# Patient Record
Sex: Female | Born: 1960 | Race: White | Hispanic: No | Smoking: Current every day smoker
Health system: Southern US, Community
[De-identification: ages and names within clinical notes are randomized; demographics above are authoritative.]

## PROBLEM LIST (undated history)

## (undated) DIAGNOSIS — F419 Anxiety disorder, unspecified: Secondary | ICD-10-CM

## (undated) DIAGNOSIS — K219 Gastro-esophageal reflux disease without esophagitis: Secondary | ICD-10-CM

---

## 2002-03-28 ENCOUNTER — Emergency Department (HOSPITAL_COMMUNITY): Admission: EM | Admit: 2002-03-28 | Discharge: 2002-03-28 | Payer: Self-pay | Admitting: Emergency Medicine

## 2002-04-04 ENCOUNTER — Emergency Department (HOSPITAL_COMMUNITY): Admission: EM | Admit: 2002-04-04 | Discharge: 2002-04-04 | Payer: Self-pay | Admitting: Emergency Medicine

## 2003-10-07 ENCOUNTER — Emergency Department (HOSPITAL_COMMUNITY): Admission: EM | Admit: 2003-10-07 | Discharge: 2003-10-07 | Payer: Self-pay

## 2003-12-15 ENCOUNTER — Emergency Department (HOSPITAL_COMMUNITY): Admission: EM | Admit: 2003-12-15 | Discharge: 2003-12-15 | Payer: Self-pay | Admitting: Emergency Medicine

## 2004-05-16 ENCOUNTER — Emergency Department (HOSPITAL_COMMUNITY): Admission: EM | Admit: 2004-05-16 | Discharge: 2004-05-16 | Payer: Self-pay | Admitting: Emergency Medicine

## 2004-05-19 ENCOUNTER — Emergency Department (HOSPITAL_COMMUNITY): Admission: EM | Admit: 2004-05-19 | Discharge: 2004-05-19 | Payer: Self-pay | Admitting: Emergency Medicine

## 2004-08-08 ENCOUNTER — Other Ambulatory Visit: Admission: RE | Admit: 2004-08-08 | Discharge: 2004-08-08 | Payer: Self-pay | Admitting: Obstetrics and Gynecology

## 2004-09-21 ENCOUNTER — Ambulatory Visit (HOSPITAL_COMMUNITY): Admission: RE | Admit: 2004-09-21 | Discharge: 2004-09-21 | Payer: Self-pay | Admitting: Orthopedic Surgery

## 2005-01-01 ENCOUNTER — Ambulatory Visit (HOSPITAL_COMMUNITY): Admission: RE | Admit: 2005-01-01 | Discharge: 2005-01-01 | Payer: Self-pay | Admitting: Obstetrics and Gynecology

## 2005-05-25 ENCOUNTER — Emergency Department (HOSPITAL_COMMUNITY): Admission: EM | Admit: 2005-05-25 | Discharge: 2005-05-25 | Payer: Self-pay | Admitting: Emergency Medicine

## 2005-08-23 ENCOUNTER — Other Ambulatory Visit: Admission: RE | Admit: 2005-08-23 | Discharge: 2005-08-23 | Payer: Self-pay | Admitting: Obstetrics and Gynecology

## 2006-09-24 ENCOUNTER — Other Ambulatory Visit: Admission: RE | Admit: 2006-09-24 | Discharge: 2006-09-24 | Payer: Self-pay | Admitting: Gynecology

## 2007-07-30 ENCOUNTER — Observation Stay (HOSPITAL_COMMUNITY): Admission: EM | Admit: 2007-07-30 | Discharge: 2007-07-31 | Payer: Self-pay | Admitting: Emergency Medicine

## 2007-10-08 ENCOUNTER — Other Ambulatory Visit: Admission: RE | Admit: 2007-10-08 | Discharge: 2007-10-08 | Payer: Self-pay | Admitting: Gynecology

## 2010-01-08 ENCOUNTER — Emergency Department (HOSPITAL_COMMUNITY): Admission: EM | Admit: 2010-01-08 | Discharge: 2010-01-08 | Payer: Self-pay | Admitting: Emergency Medicine

## 2010-05-03 ENCOUNTER — Ambulatory Visit (HOSPITAL_COMMUNITY): Admission: RE | Admit: 2010-05-03 | Discharge: 2010-05-03 | Payer: Self-pay | Admitting: Obstetrics and Gynecology

## 2010-12-01 ENCOUNTER — Encounter
Admission: RE | Admit: 2010-12-01 | Discharge: 2010-12-01 | Payer: Self-pay | Source: Home / Self Care | Attending: Neurological Surgery | Admitting: Neurological Surgery

## 2011-01-06 ENCOUNTER — Encounter: Payer: Self-pay | Admitting: Obstetrics and Gynecology

## 2011-01-31 ENCOUNTER — Other Ambulatory Visit: Payer: Self-pay | Admitting: Neurological Surgery

## 2011-01-31 DIAGNOSIS — M47812 Spondylosis without myelopathy or radiculopathy, cervical region: Secondary | ICD-10-CM

## 2011-02-01 ENCOUNTER — Other Ambulatory Visit: Payer: Self-pay

## 2011-02-09 ENCOUNTER — Ambulatory Visit
Admission: RE | Admit: 2011-02-09 | Discharge: 2011-02-09 | Disposition: A | Discharge status: Home / Self Care | Payer: BC Managed Care – PPO | Source: Ambulatory Visit | Attending: Neurological Surgery | Admitting: Neurological Surgery

## 2011-02-09 DIAGNOSIS — M47812 Spondylosis without myelopathy or radiculopathy, cervical region: Secondary | ICD-10-CM

## 2011-03-05 LAB — CBC
HCT: 41 % (ref 36.0–46.0)
Platelets: 378 10*3/uL (ref 150–400)
WBC: 10.8 10*3/uL — ABNORMAL HIGH (ref 4.0–10.5)

## 2011-03-05 LAB — COMPREHENSIVE METABOLIC PANEL
ALT: 19 U/L (ref 0–35)
AST: 40 U/L — ABNORMAL HIGH (ref 0–37)
Albumin: 4.1 g/dL (ref 3.5–5.2)
Alkaline Phosphatase: 73 U/L (ref 39–117)
Chloride: 111 mEq/L (ref 96–112)
GFR calc Af Amer: >60 mL/min (ref >60)
Potassium: 4.9 mEq/L (ref 3.5–5.1)
Total Bilirubin: 0.6 mg/dL (ref 0.3–1.2)

## 2011-05-01 NOTE — Discharge Summary (Signed)
NAMELAKEISHA, [name] NO.:  [id number]   MEDICAL RECORD NO.:  [id number]          PATIENT TYPE:  INP   LOCATION:  3704                         FACILITY:  [facility]   PHYSICIAN:  Altha Harm, MDDATE OF BIRTH:  Dec 29, 1960   DATE OF ADMISSION:  07/30/2007  DATE OF DISCHARGE:  07/31/2007                               DISCHARGE SUMMARY   DISCHARGE DISPOSITION:  Home.   FINAL DISCHARGE DIAGNOSES:  1. Chest pain, atypical.  2. Hyperlipidemia.  3. Menopause.  4. History of depression.  5. Tobacco use disorder.   DISCHARGE MEDICATIONS:  1. Niaspan 500 mg p.o. h.s.  2. Aspirin 325 mg p.o. 30 minutes before taking Niaspan.  3. Simvastatin 20 mg p.o. q.h.s.  4. Prilosec 20 mg p.o. daily.  5. Cymbalta 60 mg p.o. every other day.  6. Estrogen at the dose prior to admission.  7. Vitamin B12 one tab p.o. daily.  8. Vitamin D 1 tab p.o. daily.  9. Melatonin p.o. daily.   CONSULTANTS:  [facility], Dr. Tresa Endo.   PROCEDURE:  None.   DIAGNOSTIC STUDIES:  1. Persantine Myoview.  Results are still pending.  2. Chest x-ray, which shows no active lung disease.  Lungs      hyperinflated.   CODE STATUS:  FULL CODE.   ALLERGIES:  NO KNOWN DRUG ALLERGIES.   PRIMARY CARE PHYSICIAN:  Dr. Juleen China.   CHIEF COMPLAINT:  Chest pain.   HISTORY OF PRESENT ILLNESS:  Please see H&P dictated by Dr. Lovell Sheehan for  details of the HPI.   HOSPITAL COURSE:  Chest pain.  The patient was admitted.  Her point of  care markers were negative and her enzymes were cycled, which continued  to be negative throughout her hospital stay.  [facility] was consulted.  Stress test and Persantine Myoview was  performed, the results of which are still pending.  The patient also had  her cholesterol checked, that was found to have hyperlipidemia with an  LDL elevated and a low HDL.  Per cardiology, the patient was started on  Niaspan and simvastatin.  The patient had  no further chest pain during  her hospitalization.  From the medical standpoint, the patient is  stable, however, the patient is still awaiting clearance from cardiology  to go home.  Cardiologists have made a tentative appointment on August  26/2008 at 11:30 with their office for follow up.  From a medical  standpoint, the patient is to follow up with Dr. Juleen China in approximately  1-2 weeks.  The patient should have her cholesterol rechecked in  approximately 6 weeks for further titration of medications.  At this  point, the patient is awaiting permission from cardiology to be  discharged home.      Altha Harm, MD  Electronically Signed     MAM/MEDQ  D:  07/31/2007  T:  07/31/2007  Job:  [id number]   cc:   [name], M.D.

## 2011-05-01 NOTE — H&P (Signed)
NAMEALBERTIA, [name] NO.:  [id number]   MEDICAL RECORD NO.:  [id number]          PATIENT TYPE:  INP   LOCATION:  3704                         FACILITY:  [facility]   PHYSICIAN:  Della Goo, M.D. DATE OF BIRTH:  07/30/1961   DATE OF ADMISSION:  07/30/2007  DATE OF DISCHARGE:                              HISTORY & PHYSICAL   CHIEF COMPLAINT:  Chest pain.   HISTORY OF PRESENT ILLNESS:  This is a 50 year old female presenting to  the emergency department with complaints of severe substernal area chest  pain.  She describes the pain as being in the mid chest area and  describes it as being heaviness in her chest.  The patient describes the  pain at the worst as being a 7/10.  She reports that at times the pain  does radiate into the left arm and chest area.  She does report that  this has been an ongoing pain, off and on for approximately a month.  She reports the pain has become worse over the past 24 hours.  She does  report having symptoms of shortness of breath.  Denies having any  nausea, vomiting, diaphoresis..  The patient does have risk factors of  tobacco history and is currently menopausal.   PAST MEDICAL HISTORY:  None.   PAST SURGICAL HISTORY:  None.   MEDICATIONS:  Cymbalta 60 mg 1 p.o. every other day.  The patient  reports being weaned off this medication.   ALLERGIES:  NO KNOWN DRUG ALLERGIES.   SOCIAL HISTORY:  The patient is married.  Positive tobacco history,  nondrinker.   FAMILY HISTORY:  Negative for coronary artery disease in her parents and  siblings.   REVIEW OF SYSTEMS:  Pertinent as mentioned above.   PHYSICAL EXAMINATION:  GENERAL APPEARANCE:  This is a well-nourished,  well-developed 51 year old female in discomfort, but no acute distress.  VITAL SIGNS:  Temperature 97.7, blood pressure 130/80, heart rate 77,  respirations 18, O2 saturation 99% on room air.  HEENT: Examination normocephalic, atraumatic.  Pupils equally  round,  reactive to light.  Extraocular muscles are intact.  Funduscopic benign.  Oropharynx is clear.  The patient has a denture in the upper palate.  There are no exudates or erythema.  NECK:  Neck is supple.  Full range of motion.  No thyromegaly,  adenopathy, jugular venous distension.  CARDIOVASCULAR:  Regular rate and rhythm.  No murmurs, gallops or rubs.  LUNGS: Clear to auscultation bilaterally.  ABDOMEN:  Positive bowel sounds, soft, nontender, nondistended.  EXTREMITIES: Without cyanosis, clubbing or edema.  NEUROLOGIC EXAMINATION:  Alert and oriented x3.  There are no focal  deficits.  RECTAL:  Deferred.  GENITOURINARY:  Deferred.   LABORATORY DATA:  Sodium 138, potassium 4.0, chloride 104, bicarbonate  27, BUN 15, glucose 97.  Cardiac enzymes:  Myoglobin 43.1, CK-MB less  than 1.0, troponin less than 0.05.  EKG revealed a normal sinus rhythm  without acute ST-segment changes.   ASSESSMENT:  38. A 50 year old female being admitted with chest pain.  2. Menopausal history.  3. Tobacco history.   PLAN:  The patient will be admitted to telemetry area for cardiac  monitoring and cardiac enzymes will be performed.  She will be placed on  nitrates, aspirin and oxygen therapy.  She will also be placed on DVT  and GI prophylaxis therapy.  Her home medications need to be verified.  She does take Estrogen and Prometrium therapy.  Pending results of her  cardiac enzymes and her clinical symptoms, further cardiac workup will  ensue.      Della Goo, M.D.  Electronically Signed     HJ/MEDQ  D:  07/30/2007  T:  07/31/2007  Job:  [id number]   cc:   Brooke Bonito, M.D.

## 2011-07-16 ENCOUNTER — Other Ambulatory Visit: Payer: Self-pay | Admitting: Neurological Surgery

## 2011-07-16 DIAGNOSIS — M47812 Spondylosis without myelopathy or radiculopathy, cervical region: Secondary | ICD-10-CM

## 2011-07-17 ENCOUNTER — Ambulatory Visit
Admission: RE | Admit: 2011-07-17 | Discharge: 2011-07-17 | Disposition: A | Discharge status: Home / Self Care | Payer: BC Managed Care – PPO | Source: Ambulatory Visit | Attending: Neurological Surgery | Admitting: Neurological Surgery

## 2011-07-17 VITALS — BP 100/60 | HR 78

## 2011-07-17 DIAGNOSIS — M47812 Spondylosis without myelopathy or radiculopathy, cervical region: Secondary | ICD-10-CM

## 2011-07-17 MED ORDER — TRIAMCINOLONE ACETONIDE 40 MG/ML IJ SUSP (RADIOLOGY)
60.0000 mg | Freq: Once | INTRAMUSCULAR | Status: AC
  Administered 2011-07-17: 60 mg via EPIDURAL

## 2011-07-17 MED ORDER — IOHEXOL 300 MG/ML  SOLN
1.0000 mL | Freq: Once | INTRAMUSCULAR | Status: AC | PRN
  Administered 2011-07-17: 1 mL via EPIDURAL

## 2011-10-01 LAB — CK TOTAL AND CKMB (NOT AT ARMC)
CK, MB: 1.1
CK, MB: 1.2
Relative Index: INVALID
Relative Index: INVALID
Total CK: 69
Total CK: 77
Total CK: 82
Total CK: 92

## 2011-10-01 LAB — BASIC METABOLIC PANEL
BUN: 15
Calcium: 9
GFR calc non Af Amer: >60
Glucose, Bld: 100 — ABNORMAL HIGH

## 2011-10-01 LAB — TROPONIN I
Troponin I: 0.01
Troponin I: 0.01
Troponin I: 0.01

## 2011-10-01 LAB — LIPID PANEL
HDL: 26 — ABNORMAL LOW
Triglycerides: 115

## 2011-10-01 LAB — CBC
Platelets: 301
RDW: 13.4
WBC: 8.6

## 2011-10-01 LAB — I-STAT 8, (EC8 V) (CONVERTED LAB)
Bicarbonate: 26.3 — ABNORMAL HIGH
Glucose, Bld: 97
TCO2: 28
pH, Ven: 7.362 — ABNORMAL HIGH

## 2011-10-01 LAB — ESTROGENS, TOTAL: Estrogen: 199.9

## 2011-10-01 LAB — POCT CARDIAC MARKERS
CKMB, poc: <1 — ABNORMAL LOW
Myoglobin, poc: 43.1

## 2011-10-01 LAB — TSH: TSH: 1.159

## 2012-04-28 ENCOUNTER — Other Ambulatory Visit: Payer: Self-pay | Admitting: Obstetrics and Gynecology

## 2012-04-28 DIAGNOSIS — R928 Other abnormal and inconclusive findings on diagnostic imaging of breast: Secondary | ICD-10-CM

## 2012-04-30 ENCOUNTER — Ambulatory Visit
Admission: RE | Admit: 2012-04-30 | Discharge: 2012-04-30 | Disposition: A | Discharge status: Home / Self Care | Payer: BC Managed Care – PPO | Source: Ambulatory Visit | Attending: Obstetrics and Gynecology | Admitting: Obstetrics and Gynecology

## 2012-04-30 DIAGNOSIS — R928 Other abnormal and inconclusive findings on diagnostic imaging of breast: Secondary | ICD-10-CM

## 2012-05-06 ENCOUNTER — Other Ambulatory Visit: Payer: Self-pay | Admitting: Obstetrics and Gynecology

## 2012-05-20 ENCOUNTER — Other Ambulatory Visit: Payer: Self-pay | Admitting: Obstetrics and Gynecology

## 2012-11-10 ENCOUNTER — Other Ambulatory Visit: Payer: Self-pay | Admitting: Neurological Surgery

## 2012-11-10 DIAGNOSIS — M5412 Radiculopathy, cervical region: Secondary | ICD-10-CM

## 2012-11-11 ENCOUNTER — Ambulatory Visit
Admission: RE | Admit: 2012-11-11 | Discharge: 2012-11-11 | Disposition: A | Discharge status: Home / Self Care | Payer: BC Managed Care – PPO | Source: Ambulatory Visit | Attending: Neurological Surgery | Admitting: Neurological Surgery

## 2012-11-11 VITALS — BP 96/67 | HR 70

## 2012-11-11 DIAGNOSIS — M5020 Other cervical disc displacement, unspecified cervical region: Secondary | ICD-10-CM

## 2012-11-11 DIAGNOSIS — M5412 Radiculopathy, cervical region: Secondary | ICD-10-CM

## 2012-11-11 MED ORDER — IOHEXOL 300 MG/ML  SOLN
1.0000 mL | Freq: Once | INTRAMUSCULAR | Status: AC | PRN
  Administered 2012-11-11: 1 mL via INTRAVENOUS

## 2012-11-11 MED ORDER — TRIAMCINOLONE ACETONIDE 40 MG/ML IJ SUSP (RADIOLOGY)
60.0000 mg | Freq: Once | INTRAMUSCULAR | Status: AC
  Administered 2012-11-11: 60 mg via EPIDURAL

## 2012-12-08 ENCOUNTER — Other Ambulatory Visit: Payer: Self-pay | Admitting: Neurological Surgery

## 2012-12-08 DIAGNOSIS — M542 Cervicalgia: Secondary | ICD-10-CM

## 2013-01-13 ENCOUNTER — Other Ambulatory Visit: Payer: Self-pay | Admitting: Neurological Surgery

## 2013-01-13 DIAGNOSIS — M5412 Radiculopathy, cervical region: Secondary | ICD-10-CM

## 2013-01-20 ENCOUNTER — Ambulatory Visit
Admission: RE | Admit: 2013-01-20 | Discharge: 2013-01-20 | Disposition: A | Discharge status: Home / Self Care | Payer: BC Managed Care – PPO | Source: Ambulatory Visit | Attending: Neurological Surgery | Admitting: Neurological Surgery

## 2013-01-20 VITALS — BP 113/74 | HR 63

## 2013-01-20 DIAGNOSIS — M5412 Radiculopathy, cervical region: Secondary | ICD-10-CM

## 2013-01-20 MED ORDER — TRIAMCINOLONE ACETONIDE 40 MG/ML IJ SUSP (RADIOLOGY)
60.0000 mg | Freq: Once | INTRAMUSCULAR | Status: AC
  Administered 2013-01-20: 60 mg via EPIDURAL

## 2013-01-20 MED ORDER — IOHEXOL 300 MG/ML  SOLN
1.0000 mL | Freq: Once | INTRAMUSCULAR | Status: AC | PRN
  Administered 2013-01-20: 1 mL via EPIDURAL

## 2013-09-09 ENCOUNTER — Encounter: Payer: Self-pay | Admitting: Internal Medicine

## 2013-10-09 ENCOUNTER — Encounter: Payer: Self-pay | Admitting: Internal Medicine

## 2013-10-13 ENCOUNTER — Ambulatory Visit: Payer: BC Managed Care – PPO | Admitting: Internal Medicine

## 2014-01-06 ENCOUNTER — Other Ambulatory Visit: Payer: Self-pay | Admitting: Neurological Surgery

## 2014-01-06 DIAGNOSIS — M5412 Radiculopathy, cervical region: Secondary | ICD-10-CM

## 2014-01-11 ENCOUNTER — Ambulatory Visit
Admission: RE | Admit: 2014-01-11 | Discharge: 2014-01-11 | Disposition: A | Discharge status: Home / Self Care | Payer: BC Managed Care – PPO | Source: Ambulatory Visit | Attending: Neurological Surgery | Admitting: Neurological Surgery

## 2014-01-11 DIAGNOSIS — M5412 Radiculopathy, cervical region: Secondary | ICD-10-CM

## 2014-01-11 MED ORDER — TRIAMCINOLONE ACETONIDE 40 MG/ML IJ SUSP (RADIOLOGY)
60.0000 mg | Freq: Once | INTRAMUSCULAR | Status: AC
  Administered 2014-01-11: 60 mg via EPIDURAL

## 2014-01-11 MED ORDER — IOHEXOL 300 MG/ML  SOLN
1.0000 mL | Freq: Once | INTRAMUSCULAR | Status: AC | PRN
  Administered 2014-01-11: 1 mL via INTRAVENOUS

## 2014-07-14 ENCOUNTER — Other Ambulatory Visit: Payer: Self-pay | Admitting: Obstetrics and Gynecology

## 2015-02-08 ENCOUNTER — Other Ambulatory Visit: Payer: Self-pay | Admitting: Obstetrics and Gynecology

## 2015-02-09 LAB — CYTOLOGY - PAP

## 2015-04-08 ENCOUNTER — Other Ambulatory Visit: Payer: Self-pay | Admitting: Neurological Surgery

## 2015-04-08 DIAGNOSIS — M47812 Spondylosis without myelopathy or radiculopathy, cervical region: Secondary | ICD-10-CM

## 2015-04-12 ENCOUNTER — Ambulatory Visit
Admission: RE | Admit: 2015-04-12 | Discharge: 2015-04-12 | Disposition: A | Discharge status: Home / Self Care | Payer: BLUE CROSS/BLUE SHIELD | Source: Ambulatory Visit | Attending: Neurological Surgery | Admitting: Neurological Surgery

## 2015-04-12 VITALS — BP 102/64 | HR 57

## 2015-04-12 DIAGNOSIS — M5090 Cervical disc disorder, unspecified, unspecified cervical region: Secondary | ICD-10-CM

## 2015-04-12 DIAGNOSIS — M47812 Spondylosis without myelopathy or radiculopathy, cervical region: Secondary | ICD-10-CM

## 2015-04-12 MED ORDER — TRIAMCINOLONE ACETONIDE 40 MG/ML IJ SUSP (RADIOLOGY)
60.0000 mg | Freq: Once | INTRAMUSCULAR | Status: AC
  Administered 2015-04-12: 60 mg via EPIDURAL

## 2015-04-12 MED ORDER — IOHEXOL 300 MG/ML  SOLN
1.0000 mL | Freq: Once | INTRAMUSCULAR | Status: AC | PRN
  Administered 2015-04-12: 1 mL via EPIDURAL

## 2017-02-27 ENCOUNTER — Emergency Department (HOSPITAL_COMMUNITY): Payer: BLUE CROSS/BLUE SHIELD

## 2017-02-27 ENCOUNTER — Encounter (HOSPITAL_COMMUNITY): Payer: Self-pay

## 2017-02-27 ENCOUNTER — Emergency Department (HOSPITAL_COMMUNITY)
Admission: EM | Admit: 2017-02-27 | Discharge: 2017-02-27 | Disposition: A | Discharge status: Home / Self Care | Payer: BLUE CROSS/BLUE SHIELD | Attending: Emergency Medicine | Admitting: Emergency Medicine

## 2017-02-27 DIAGNOSIS — Y92009 Unspecified place in unspecified non-institutional (private) residence as the place of occurrence of the external cause: Secondary | ICD-10-CM | POA: Insufficient documentation

## 2017-02-27 DIAGNOSIS — S060X0A Concussion without loss of consciousness, initial encounter: Secondary | ICD-10-CM | POA: Insufficient documentation

## 2017-02-27 DIAGNOSIS — Y999 Unspecified external cause status: Secondary | ICD-10-CM | POA: Insufficient documentation

## 2017-02-27 DIAGNOSIS — S0990XA Unspecified injury of head, initial encounter: Secondary | ICD-10-CM

## 2017-02-27 DIAGNOSIS — W2203XA Walked into furniture, initial encounter: Secondary | ICD-10-CM | POA: Insufficient documentation

## 2017-02-27 DIAGNOSIS — Y9389 Activity, other specified: Secondary | ICD-10-CM | POA: Insufficient documentation

## 2017-02-27 MED ORDER — DEXAMETHASONE SODIUM PHOSPHATE 10 MG/ML IJ SOLN
10.0000 mg | Freq: Once | INTRAMUSCULAR | Status: AC
  Administered 2017-02-27: 10 mg via INTRAVENOUS
  Filled 2017-02-27: qty 1

## 2017-02-27 MED ORDER — DIPHENHYDRAMINE HCL 50 MG/ML IJ SOLN
25.0000 mg | Freq: Once | INTRAMUSCULAR | Status: AC
  Administered 2017-02-27: 25 mg via INTRAVENOUS
  Filled 2017-02-27: qty 1

## 2017-02-27 MED ORDER — PROCHLORPERAZINE EDISYLATE 5 MG/ML IJ SOLN
10.0000 mg | Freq: Once | INTRAMUSCULAR | Status: AC
  Administered 2017-02-27: 10 mg via INTRAVENOUS
  Filled 2017-02-27: qty 2

## 2017-02-27 MED ORDER — SODIUM CHLORIDE 0.9 % IV BOLUS (SEPSIS)
1000.0000 mL | Freq: Once | INTRAVENOUS | Status: AC
  Administered 2017-02-27: 1000 mL via INTRAVENOUS

## 2017-02-27 NOTE — Discharge Instructions (Signed)
Please stay hydrated. Please use over-the-counter pain medicines to help her headache. Please follow-up with your primary care physician for further management of your headaches and head injury. I suspect he had a concussion. If any symptoms acutely change or worsen or you develop new symptoms, please return to the nearest emergency department. Please be safe and do not have any falls.

## 2017-02-27 NOTE — ED Provider Notes (Signed)
[facility]-EMERGENCY DEPT Provider Note   CSN: [id number] Arrival date & time: 02/27/17  1310  By signing my name below, I, Rosario Adie, attest that this documentation has been prepared under the direction and in the presence of Heide Scales, MD. Electronically Signed: Rosario Adie, ED Scribe. 02/27/17. 3:13 PM.  History   Chief Complaint Chief Complaint  Patient presents with  . Head Injury   The history is provided by the patient. No language interpreter was used.  Head Injury   The incident occurred more than 2 days ago. She came to the ER via walk-in. The injury mechanism was a direct blow. There was no loss of consciousness. There was no blood loss. The pain is at a severity of 3/10. The pain is moderate. The pain has been intermittent since the injury. Pertinent negatives include no numbness, no blurred vision, no vomiting, no disorientation, no weakness and no memory loss. She has tried prescription drugs for the symptoms. The treatment provided moderate relief.   HPI Comments: [name] is a 56 y.o. female with no pertinent PMHx, who presents to the Emergency Department complaining of intermittent occiput headaches and episodes of lightheadedness s/p occiput head injury which occurred four days ago. Per pt, she bent over to pick up and object off of the floor in her home four days ago and upon standing upright she struck the occiput of her head on a wooden shelf above her. No LOC or wounds were sustained during the incident. She states that immediately after striking her head that she had visual disturbance described as "seeing stars" and several hours after she had a moderate occiput headache. Her pain had improved several hours after, however, since yesterday she states that she has had intermittent episodes of being generally off-balance and lightheadedness, with recurrent occiput headaches and nausea. She took 0.5 Vicodin earlier today and notes that since  taking this her headache has improved to a 3/10. Pt has a h/o neck pain d/t bone spurts, but she denies her neck pain having acutely worsened since her injury. Pt is not currently on anticoagulant or antiplatelet therapy. Pt denies current visual disturbance, focal weakness/numbness, confusion, or any other associated symptoms.   History reviewed. No pertinent past medical history.  There are no active problems to display for this patient.  History reviewed. No pertinent surgical history.  OB History    No data available     Home Medications    Prior to Admission medications   Not on File   Family History History reviewed. No pertinent family history.  Social History Social History  Substance Use Topics  . Smoking status: Current Every Day Smoker    Packs/day: 1.00    Years: 35.00    Types: Cigarettes  . Smokeless tobacco: Never Used  . Alcohol use No   Allergies   Patient has no known allergies.   Review of Systems Review of Systems  Constitutional: Negative for activity change, chills, diaphoresis, fatigue and fever.  HENT: Negative for congestion and rhinorrhea.   Eyes: Positive for visual disturbance (resolved). Negative for blurred vision.  Respiratory: Negative for cough, chest tightness, shortness of breath and stridor.   Cardiovascular: Negative for chest pain, palpitations and leg swelling.  Gastrointestinal: Negative for abdominal distention, abdominal pain, constipation, diarrhea, nausea and vomiting.  Genitourinary: Negative for difficulty urinating, dysuria, flank pain, frequency, hematuria, menstrual problem, pelvic pain, vaginal bleeding and vaginal discharge.  Musculoskeletal: Negative for back pain.  Skin: Negative for  rash and wound.  Neurological: Positive for light-headedness and headaches. Negative for dizziness, weakness and numbness.  Psychiatric/Behavioral: Negative for agitation, confusion and memory loss.  All other systems reviewed and are  negative.  Physical Exam Updated Vital Signs BP (!) 144/101 (BP Location: Left Arm)   Pulse 83   Temp 98 F (36.7 C) (Oral)   Resp 18   SpO2 97%   Physical Exam  Constitutional: She is oriented to person, place, and time. She appears well-developed and well-nourished. No distress.  HENT:  Head: Normocephalic and atraumatic.  Right Ear: External ear normal.  Left Ear: External ear normal.  Nose: Nose normal.  Mouth/Throat: Oropharynx is clear and moist. No oropharyngeal exudate.  Eyes: Conjunctivae and EOM are normal. Pupils are equal, round, and reactive to light.  Neck: Normal range of motion. Neck supple.  Cardiovascular: Normal rate, regular rhythm and normal heart sounds.   Pulmonary/Chest: Effort normal and breath sounds normal. No stridor. No respiratory distress. She has no wheezes. She has no rales.  Abdominal: She exhibits no distension. There is no tenderness. There is no rebound.  Neurological: She is alert and oriented to person, place, and time. She has normal reflexes. No cranial nerve deficit. She exhibits normal muscle tone. Coordination normal.  Felt subjectively unsteady with walking. No focal neurological deficit. Normal finger to nose testing bilaterally.    Skin: Skin is warm. No rash noted. She is not diaphoretic. No erythema.  Nursing note and vitals reviewed.  ED Treatments / Results  DIAGNOSTIC STUDIES: Oxygen Saturation is 97% on RA, normal by my interpretation.   COORDINATION OF CARE: 3:11 PM-Discussed next steps with pt. Pt verbalized understanding and is agreeable with the plan.   Labs (all labs ordered are listed, but only abnormal results are displayed) Labs Reviewed - No data to display  EKG  EKG Interpretation None      Radiology Ct Head Wo Contrast  Result Date: 02/27/2017 CLINICAL DATA:  Hit head on drawer a few days ago. Headache. Nausea and dizziness. EXAM: CT HEAD WITHOUT CONTRAST TECHNIQUE: Contiguous axial images were obtained  from the base of the skull through the vertex without intravenous contrast. COMPARISON:  None. FINDINGS: Brain: No acute infarct, hemorrhage, or mass lesion is present. The no significant white matter disease is present. The brainstem and cerebellum are normal. Vascular: Mild vascular calcifications are present. There is no hyperdense vessel. Skull: Calvarium is intact. No significant extracranial soft tissue injury is evident. Sinuses/Orbits: Paranasal sinuses and mastoid air cells are clear. The globes and orbits are normal. IMPRESSION: Negative CT of the head. Electronically Signed   By: Marin Robertshristopher  Mattern M.D.   On: 02/27/2017 16:35    Procedures Procedures   Medications Ordered in ED Medications  sodium chloride 0.9 % bolus 1,000 mL (0 mLs Intravenous Stopped 02/27/17 1803)  prochlorperazine (COMPAZINE) injection 10 mg (10 mg Intravenous Given 02/27/17 1650)  diphenhydrAMINE (BENADRYL) injection 25 mg (25 mg Intravenous Given 02/27/17 1650)  dexamethasone (DECADRON) injection 10 mg (10 mg Intravenous Given 02/27/17 1806)    Initial Impression / Assessment and Plan / ED Course  I have reviewed the triage vital signs and the nursing notes.  Pertinent labs & imaging results that were available during my care of the patient were reviewed by me and considered in my medical decision making (see chart for details).     [name] is a 56 y.o. female with no pertinent PMHx, who presents to the Emergency Department complaining of headaches  and unsteadiness after a head injury several days ago.   History and exam are seen above.  On exam, patient had some mild unsteadiness with gait with no focal rales. Normal finger-nose-finger. Normal vision. No areas of tenderness on the head. Neck nontender. Lungs were clear abdomen nontender.  Based on patient's description of injury and subsequent symptoms, suspect a postconcussion type injury however, given the persistent headache and some  unsteadiness, patient will have a CT scan to look for traumatic injury or intracranial bleed.   Patient was given combination of headache medications while awaiting imaging results.  CT imaging showed no acute abnormalities. Specifically, no traumatic injuries, fractures, or bleeding.  Patient felt much better after the medications. Patient given steroids to complete her headache cocktail.  Patient informed she likely has a concussion. Patient given instructions to follow-up with a PCP for further management.  Patient given instructions to use over-the-counter pain medications. Patient had no other questions or concerns and patient was discharged in good condition with understanding of follow-up instructions and return precautions. Patient discharged in good condition with normal gait.    Final Clinical Impressions(s) / ED Diagnoses   Final diagnoses:  Injury of head, initial encounter  Concussion without loss of consciousness, initial encounter    I personally performed the services described in this documentation, which was scribed in my presence. The recorded information has been reviewed and is accurate.   Clinical Impression: 1. Injury of head, initial encounter   2. Concussion without loss of consciousness, initial encounter     Disposition: Discharge  Condition: Good  I have discussed the results, Dx and Tx plan with the pt(& family if present). He/she/they expressed understanding and agree(s) with the plan. Discharge instructions discussed at great length. Strict return precautions discussed and pt &/or family have verbalized understanding of the instructions. No further questions at time of discharge.    New Prescriptions   No medications on file    Follow Up: [facility] [address] [place] [address] [phone] Schedule an appointment as soon as possible for a visit    [facility]  EMERGENCY DEPARTMENT [address] [id number] mc [place] [address] [phone]  If symptoms worsen      Heide Scales, MD 02/27/17 [phone]

## 2017-02-27 NOTE — ED Triage Notes (Signed)
Pt states she hit her head on a drawer on Saturday. She states the top of her head hurts and she has had intermittent spells of being off balance. Pt is alert and oriented. Pt denies blood thinner use.

## 2019-03-09 ENCOUNTER — Emergency Department (HOSPITAL_COMMUNITY): Payer: Self-pay

## 2019-03-09 ENCOUNTER — Other Ambulatory Visit: Payer: Self-pay

## 2019-03-09 ENCOUNTER — Encounter (HOSPITAL_COMMUNITY): Payer: Self-pay

## 2019-03-09 ENCOUNTER — Emergency Department (HOSPITAL_COMMUNITY)
Admission: EM | Admit: 2019-03-09 | Discharge: 2019-03-09 | Disposition: A | Discharge status: Home / Self Care | Payer: Self-pay | Attending: Emergency Medicine | Admitting: Emergency Medicine

## 2019-03-09 DIAGNOSIS — R0789 Other chest pain: Secondary | ICD-10-CM | POA: Insufficient documentation

## 2019-03-09 DIAGNOSIS — R911 Solitary pulmonary nodule: Secondary | ICD-10-CM | POA: Insufficient documentation

## 2019-03-09 DIAGNOSIS — F17210 Nicotine dependence, cigarettes, uncomplicated: Secondary | ICD-10-CM | POA: Insufficient documentation

## 2019-03-09 DIAGNOSIS — F419 Anxiety disorder, unspecified: Secondary | ICD-10-CM | POA: Insufficient documentation

## 2019-03-09 HISTORY — DX: Anxiety disorder, unspecified: F41.9

## 2019-03-09 LAB — CBC
HEMATOCRIT: 41.9 % (ref 36.0–46.0)
Hemoglobin: 13.9 g/dL (ref 12.0–15.0)
MCH: 31.1 pg (ref 26.0–34.0)
MCHC: 33.2 g/dL (ref 30.0–36.0)
MCV: 93.7 fL (ref 80.0–100.0)
NRBC: 0 % (ref 0.0–0.2)
PLATELETS: 312 10*3/uL (ref 150–400)
RBC: 4.47 MIL/uL (ref 3.87–5.11)
RDW: 13.7 % (ref 11.5–15.5)
WBC: 9 10*3/uL (ref 4.0–10.5)

## 2019-03-09 LAB — BASIC METABOLIC PANEL
Anion gap: 11 (ref 5–15)
BUN: 8 mg/dL (ref 6–20)
CALCIUM: 8.9 mg/dL (ref 8.9–10.3)
CO2: 22 mmol/L (ref 22–32)
Chloride: 105 mmol/L (ref 98–111)
Creatinine, Ser: 0.59 mg/dL (ref 0.44–1.00)
GFR calc non Af Amer: >60 mL/min (ref >60)
Glucose, Bld: 100 mg/dL — ABNORMAL HIGH (ref 70–99)
Potassium: 4.5 mmol/L (ref 3.5–5.1)
SODIUM: 138 mmol/L (ref 135–145)

## 2019-03-09 LAB — I-STAT TROPONIN, ED
TROPONIN I, POC: 0 ng/mL (ref 0.00–0.08)
Troponin i, poc: 0 ng/mL (ref 0.00–0.08)

## 2019-03-09 LAB — D-DIMER, QUANTITATIVE: D-Dimer, Quant: 0.64 ug/mL-FEU — ABNORMAL HIGH (ref 0.00–0.50)

## 2019-03-09 MED ORDER — IOHEXOL 350 MG/ML SOLN
100.0000 mL | Freq: Once | INTRAVENOUS | Status: AC | PRN
  Administered 2019-03-09: 59 mL via INTRAVENOUS

## 2019-03-09 MED ORDER — NITROGLYCERIN 0.4 MG SL SUBL
0.4000 mg | SUBLINGUAL_TABLET | SUBLINGUAL | Status: DC | PRN
  Filled 2019-03-09: qty 1

## 2019-03-09 MED ORDER — HYDROXYZINE HCL 25 MG PO TABS: 25.0000 mg | ORAL_TABLET | Freq: Four times a day (QID) | ORAL | 0 refills | Status: DC

## 2019-03-09 NOTE — ED Triage Notes (Signed)
Pt from home with ems for chest pressure x2 weeks but today at 6am woke her out of her sleep. Pt given 324 ASA and 1 Nitro en route. Pain 9/10 before nitro and pain now 4/10. Pt a.o, nad noted. NSR on EKG.

## 2019-03-09 NOTE — ED Notes (Signed)
Pt transferred to CT.

## 2019-03-09 NOTE — ED Provider Notes (Signed)
[facility] EMERGENCY DEPARTMENT Provider Note   CSN: [id number] Arrival date & time: 03/09/19  1257    History   Chief Complaint Chief Complaint  Patient presents with  . Chest Pain    HPI [name] is a 58 y.o. female.     Patient is a 58 year old female with past medical history of anxiety who presents emergency department for chest pain.  She reports that she has been having chest pain, palpitations, diaphoresis off and on for the past 2 weeks.  She reports that she is attributing this to increased stress and anxiety and being off of her Lexapro and not having insurance to see her regular doctor.  Reports that things felt much worse this morning when she woke up she felt pressure across her chest, anxiety, palpitations.  She was evaluated by EMS who told her she should come to the hospital and get checked out.  Per EMS, she was given 1 dose of sublingual nitro.  Reports that she is feeling more calm now and not having any chest pain.  She reports "I think I just need some antidepressants".  She does not have a history of hypertension or diabetes.  She has no family history of cardiac disease.  She does smoke.     Past Medical History:  Diagnosis Date  . Anxiety     There are no active problems to display for this patient.   History reviewed. No pertinent surgical history.   OB History   No obstetric history on file.      Home Medications    Prior to Admission medications   Medication Sig Start Date End Date Taking? Authorizing Provider  hydrOXYzine (ATARAX/VISTARIL) 25 MG tablet Take 1 tablet (25 mg total) by mouth every 6 (six) hours. 03/09/19   Arlyn Dunning, PA-C    Family History No family history on file.  Social History Social History   Tobacco Use  . Smoking status: Current Every Day Smoker    Packs/day: 1.00    Years: 35.00    Pack years: 35.00    Types: Cigarettes  . Smokeless tobacco: Never Used  Substance Use Topics   . Alcohol use: No  . Drug use: Not on file     Allergies   Patient has no known allergies.   Review of Systems Review of Systems   Physical Exam Updated Vital Signs BP 114/71   Pulse 77   Temp 98.1 F (36.7 C) (Oral)   Resp 20   SpO2 96%   Physical Exam   ED Treatments / Results  Labs (all labs ordered are listed, but only abnormal results are displayed) Labs Reviewed  BASIC METABOLIC PANEL - Abnormal; Notable for the following components:      Result Value   Glucose, Bld 100 (*)    All other components within normal limits  D-DIMER, QUANTITATIVE (NOT AT [facility]) - Abnormal; Notable for the following components:   D-Dimer, Quant 0.64 (*)    All other components within normal limits  CBC  I-STAT TROPONIN, ED  I-STAT TROPONIN, ED    EKG EKG Interpretation  Date/Time:  Monday March 09 2019 13:05:59 EDT Ventricular Rate:  71 PR Interval:    QRS Duration: 106 QT Interval:  441 QTC Calculation: 480 R Axis:   68 Text Interpretation:  Sinus rhythm RSR' in V1 or V2, right VCD or RVH Probable anteroseptal infarct, old Confirmed by Kristine Royal [phone]) on 03/09/2019 1:11:45 PM   Radiology  Dg Chest 2 View  Result Date: 03/09/2019 CLINICAL DATA:  Chest pain, shortness of breath and clamminess for 1 day, smoker EXAM: CHEST - 2 VIEW COMPARISON:  None FINDINGS: Normal heart size, mediastinal contours, and pulmonary vascularity. Atherosclerotic calcification aorta. Lungs hyperinflated but clear. No pulmonary infiltrate, pleural effusion or pneumothorax. Cervical disc prostheses. Degenerative disc disease changes thoracic spine. IMPRESSION: No acute abnormalities. Electronically Signed   By: Ulyses Southward M.D.   On: 03/09/2019 14:22   Ct Angio Chest Pe W And/or Wo Contrast  Result Date: 03/09/2019 CLINICAL DATA:  58 year old with chest pressure and shortness of breath for 2 weeks. EXAM: CT ANGIOGRAPHY CHEST WITH CONTRAST TECHNIQUE: Multidetector CT imaging of the chest was  performed using the standard protocol during bolus administration of intravenous contrast. Multiplanar CT image reconstructions and MIPs were obtained to evaluate the vascular anatomy. CONTRAST:  67mL OMNIPAQUE IOHEXOL 350 MG/ML SOLN COMPARISON:  Chest radiograph 03/09/2019 FINDINGS: Cardiovascular: Satisfactory opacification of the pulmonary arteries to the segmental level. No evidence of pulmonary embolism. Normal heart size. No pericardial effusion. Normal caliber of the thoracic aorta. Mediastinum/Nodes: Thyroid tissue appears to be prominent for size. There is a 0.9 cm peripherally calcified nodule in the left thyroid lobe. No significant mediastinal or hilar lymphadenopathy. Esophagus has a normal appearance. No axillary lymph node enlargement. Lungs/Pleura: Trachea and mainstem bronchi are patent. No pleural effusions. Slightly flat nodular structure along the posterior right lower lobe measures 6 x 4 mm on the sagittal reformats, sequence 9, image 50, mean diameter of 5 mm. 3 mm nodule in the left upper lobe on sequence 6, image 26. 3 mm nodule along the left major fissure on sequence 6, image 60. 4 mm nodule in the left lower lobe superior segment on image number 53. No significant airspace disease or lung consolidation. Upper Abdomen: Right adrenal tissue is mildly prominent but very low density and could represent an adenoma or hyperplasia. Musculoskeletal: Surgical hardware in the lower cervical spine is only partially visualized. Degenerative endplate changes in the thoracic spine. No acute bone abnormality. Review of the MIP images confirms the above findings. IMPRESSION: 1. Negative for a pulmonary embolism. 2. No acute chest abnormality. 3. Several small pulmonary nodules, largest nodule measures 5 mm in diameter. No follow-up needed if patient is low-risk (and has no known or suspected primary neoplasm). Non-contrast chest CT can be considered in 12 months if patient is high-risk. This  recommendation follows the consensus statement: Guidelines for Management of Incidental Pulmonary Nodules Detected on CT Images: From the Fleischner Society 2017; Radiology 2017; 284:228-243. Electronically Signed   By: Richarda Overlie M.D.   On: 03/09/2019 17:43    Procedures Procedures (including critical care time)  Medications Ordered in ED Medications  nitroGLYCERIN (NITROSTAT) SL tablet 0.4 mg (has no administration in time range)  iohexol (OMNIPAQUE) 350 MG/ML injection 100 mL (59 mLs Intravenous Contrast Given 03/09/19 1720)     Initial Impression / Assessment and Plan / ED Course  I have reviewed the triage vital signs and the nursing notes.  Pertinent labs & imaging results that were available during my care of the patient were reviewed by me and considered in my medical decision making (see chart for details).  Clinical Course as of Mar 09 1755  Mon Mar 09, 2019  1753 Patient remains asymptomatic, low heart score. Patient would like to go home. She was advised on return precautions.  She was advised she should follow-up with the primary care doctor and I  gave her resources for this.  She was advised of the pulmonary nodules on her chest CT.  She was advised to quit smoking and to have this followed up on in 1 year or sooner if symptoms worse.  Patient reports she has been having significant amount of anxiety.  She asked for something for this.  I will give her a prescription for hydroxyzine for the next 2 weeks and advised her that she really needs to follow-up with a primary care doctor.   [KM]    Clinical Course User Index [KM] Arlyn Dunning, PA-C      Based on review of vitals, medical screening exam, lab work and/or imaging, there does not appear to be an acute, emergent etiology for the patient's symptoms. Counseled pt on good return precautions and encouraged both PCP and ED follow-up as needed.  Prior to discharge, I also discussed incidental imaging findings with patient  in detail and advised appropriate, recommended follow-up in detail.  Clinical Impression: 1. Atypical chest pain   2. Anxiety   3. Pulmonary nodule     Disposition: Discharge    This note was prepared with assistance of Dragon voice recognition software. Occasional wrong-word or sound-a-like substitutions may have occurred due to the inherent limitations of voice recognition software.   Final Clinical Impressions(s) / ED Diagnoses   Final diagnoses:  Atypical chest pain  Anxiety  Pulmonary nodule    ED Discharge Orders         Ordered    hydrOXYzine (ATARAX/VISTARIL) 25 MG tablet  Every 6 hours     03/09/19 1755           Jeral Pinch 03/09/19 1756    Wynetta Fines, MD 03/31/19 [phone]

## 2019-03-09 NOTE — ED Notes (Signed)
ED Provider at bedside. 

## 2019-03-09 NOTE — ED Notes (Signed)
Patient transported to X-ray 

## 2019-03-09 NOTE — Discharge Instructions (Signed)
Thank you for allowing me to care for you today. Please return to the emergency department if you have new or worsening symptoms. Take your medications as instructed.  ° °

## 2019-03-09 NOTE — ED Notes (Signed)
Patient verbalizes understanding of discharge instructions. Opportunity for questioning and answers were provided. Armband removed by staff, pt discharged from ED. Pt ambulatory to lobby.  

## 2019-03-09 NOTE — ED Notes (Signed)
Lab confirmed they have d-dimer specimen and will run.

## 2019-08-11 ENCOUNTER — Ambulatory Visit: Payer: Self-pay | Admitting: Gastroenterology

## 2019-08-18 ENCOUNTER — Encounter (HOSPITAL_COMMUNITY): Payer: Self-pay | Admitting: Emergency Medicine

## 2019-08-18 ENCOUNTER — Emergency Department (HOSPITAL_COMMUNITY)
Admission: EM | Admit: 2019-08-18 | Discharge: 2019-08-18 | Disposition: A | Discharge status: Home / Self Care | Payer: Self-pay | Attending: Emergency Medicine | Admitting: Emergency Medicine

## 2019-08-18 ENCOUNTER — Other Ambulatory Visit: Payer: Self-pay

## 2019-08-18 DIAGNOSIS — R109 Unspecified abdominal pain: Secondary | ICD-10-CM

## 2019-08-18 DIAGNOSIS — Z79899 Other long term (current) drug therapy: Secondary | ICD-10-CM | POA: Insufficient documentation

## 2019-08-18 DIAGNOSIS — K5900 Constipation, unspecified: Secondary | ICD-10-CM | POA: Insufficient documentation

## 2019-08-18 DIAGNOSIS — F17210 Nicotine dependence, cigarettes, uncomplicated: Secondary | ICD-10-CM | POA: Insufficient documentation

## 2019-08-18 LAB — COMPREHENSIVE METABOLIC PANEL
ALT: 15 U/L (ref 0–44)
AST: 24 U/L (ref 15–41)
Albumin: 4.3 g/dL (ref 3.5–5.0)
Alkaline Phosphatase: 76 U/L (ref 38–126)
Anion gap: 12 (ref 5–15)
BUN: 7 mg/dL (ref 6–20)
CO2: 24 mmol/L (ref 22–32)
Calcium: 9.3 mg/dL (ref 8.9–10.3)
Chloride: 107 mmol/L (ref 98–111)
Creatinine, Ser: 0.56 mg/dL (ref 0.44–1.00)
GFR calc Af Amer: >60 mL/min (ref >60)
GFR calc non Af Amer: >60 mL/min (ref >60)
Glucose, Bld: 108 mg/dL — ABNORMAL HIGH (ref 70–99)
Potassium: 3.6 mmol/L (ref 3.5–5.1)
Sodium: 143 mmol/L (ref 135–145)
Total Bilirubin: 0.5 mg/dL (ref 0.3–1.2)
Total Protein: 7.3 g/dL (ref 6.5–8.1)

## 2019-08-18 LAB — URINALYSIS, ROUTINE W REFLEX MICROSCOPIC
Bacteria, UA: NONE SEEN
Bilirubin Urine: NEGATIVE
Glucose, UA: NEGATIVE mg/dL
Hgb urine dipstick: NEGATIVE
Ketones, ur: NEGATIVE mg/dL
Nitrite: POSITIVE — AB
Protein, ur: NEGATIVE mg/dL
Specific Gravity, Urine: 1.012 (ref 1.005–1.030)
pH: 7 (ref 5.0–8.0)

## 2019-08-18 LAB — I-STAT BETA HCG BLOOD, ED (MC, WL, AP ONLY): I-stat hCG, quantitative: <5 m[IU]/mL (ref <5)

## 2019-08-18 LAB — CBC
HCT: 41.3 % (ref 36.0–46.0)
Hemoglobin: 13.6 g/dL (ref 12.0–15.0)
MCH: 32.5 pg (ref 26.0–34.0)
MCHC: 32.9 g/dL (ref 30.0–36.0)
MCV: 98.8 fL (ref 80.0–100.0)
Platelets: 401 10*3/uL — ABNORMAL HIGH (ref 150–400)
RBC: 4.18 MIL/uL (ref 3.87–5.11)
RDW: 14.3 % (ref 11.5–15.5)
WBC: 12 10*3/uL — ABNORMAL HIGH (ref 4.0–10.5)
nRBC: 0 % (ref 0.0–0.2)

## 2019-08-18 LAB — LIPASE, BLOOD: Lipase: 34 U/L (ref 11–51)

## 2019-08-18 MED ORDER — GENERIC EXTERNAL MEDICATION
Status: DC
Start: ? — End: 2019-08-18

## 2019-08-18 MED ORDER — PANTOPRAZOLE SODIUM 40 MG PO TBEC
80.0000 mg | DELAYED_RELEASE_TABLET | Freq: Every day | ORAL | Status: DC
  Administered 2019-08-18: 21:00:00 80 mg via ORAL
  Filled 2019-08-18: qty 2

## 2019-08-18 MED ORDER — BARO-CAT PO
10.00 | ORAL | Status: DC
Start: ? — End: 2019-08-18

## 2019-08-18 NOTE — Discharge Instructions (Signed)
Follow-up with your GI doctor as planned.  Take the laxative medications previously prescribed to you at the other emergency room.

## 2019-08-18 NOTE — ED Notes (Signed)
Urine culture sent with specimen 

## 2019-08-18 NOTE — ED Provider Notes (Signed)
[facility] DEPT Provider Note   CSN: [id number] Arrival date & time: 08/18/19  1812     History   Chief Complaint Chief Complaint  Patient presents with  . Abdominal Pain    HPI [name] is a 58 y.o. female.     HPI Pt has been having trouble with abdominal cramping since June.  She saw a GI doctor and had a colonoscopy and an endoscopy last week.  She still is not feeling any better.  She feels like her abdomen is bloated and tight.  She denies vomiting but continues to be nauseated.  She is able to have bowel movements.  No diarrhea.  Pt has been in contact with her GI doctor and was told to continue her current regimen but pt states they have not been helping.  She is on prilosec and is taking IB guard.  She is not on any medications for nausea.  She was taking bentyl but it made her feel sick.    Patient went to another medical facility earlier this morning on September 1.  She had an ED evaluation including laboratory tests and a CT scan.  Those records are available in the electronic medical record.  Patient CT scan showed a large amount of stool throughout the colon suggestive of constipation.  She had no evidence of bowel obstruction or free air.  Patient was not feeling any better and decided to come to the ED here for evaluation Past Medical History:  Diagnosis Date  . Anxiety     There are no active problems to display for this patient.   History reviewed. No pertinent surgical history.   OB History   No obstetric history on file.      Home Medications    Prior to Admission medications   Medication Sig Start Date End Date Taking? Authorizing Provider  acetaminophen (TYLENOL) 500 MG tablet Take 1,000 mg by mouth every 6 (six) hours as needed for moderate pain or headache.   Yes [provider]  Melatonin 3 MG TABS Take 1 tablet by mouth at bedtime as needed (sleep).   Yes [provider]  omeprazole  (PRILOSEC) 40 MG capsule Take 40 mg by mouth 2 (two) times daily.  08/04/19  Yes [provider]  hydrOXYzine (ATARAX/VISTARIL) 25 MG tablet Take 1 tablet (25 mg total) by mouth every 6 (six) hours. Patient not taking: Reported on 08/18/2019 03/09/19   Kristine Royal    Family History No family history on file.  Social History Social History   Tobacco Use  . Smoking status: Current Every Day Smoker    Packs/day: 1.00    Years: 35.00    Pack years: 35.00    Types: Cigarettes  . Smokeless tobacco: Never Used  Substance Use Topics  . Alcohol use: No  . Drug use: Not on file     Allergies   Patient has no known allergies.   Review of Systems Review of Systems  All other systems reviewed and are negative.    Physical Exam Updated Vital Signs BP (!) 162/88 (BP Location: Right Arm)   Pulse 83   Temp 99.2 F (37.3 C) (Oral)   Resp 18   SpO2 95%   Physical Exam Vitals signs and nursing note reviewed.  Constitutional:      General: She is not in acute distress.    Appearance: She is well-developed.  HENT:     Head: Normocephalic and atraumatic.  Right Ear: External ear normal.     Left Ear: External ear normal.  Eyes:     General: No scleral icterus.       Right eye: No discharge.        Left eye: No discharge.     Conjunctiva/sclera: Conjunctivae normal.  Neck:     Musculoskeletal: Neck supple.     Trachea: No tracheal deviation.  Cardiovascular:     Rate and Rhythm: Normal rate and regular rhythm.  Pulmonary:     Effort: Pulmonary effort is normal. No respiratory distress.     Breath sounds: Normal breath sounds. No stridor. No wheezing or rales.  Abdominal:     General: Bowel sounds are increased. There is no distension.     Palpations: Abdomen is soft.     Tenderness: There is abdominal tenderness. There is no guarding or rebound.     Comments: Mild generalized tenderness  Musculoskeletal:        General: No tenderness.  Skin:     General: Skin is warm and dry.     Findings: No rash.  Neurological:     Mental Status: She is alert.     Cranial Nerves: No cranial nerve deficit (no facial droop, extraocular movements intact, no slurred speech).     Sensory: No sensory deficit.     Motor: No abnormal muscle tone or seizure activity.     Coordination: Coordination normal.      ED Treatments / Results  Labs (all labs ordered are listed, but only abnormal results are displayed) Labs Reviewed  COMPREHENSIVE METABOLIC PANEL - Abnormal; Notable for the following components:      Result Value   Glucose, Bld 108 (*)    All other components within normal limits  CBC - Abnormal; Notable for the following components:   WBC 12.0 (*)    Platelets 401 (*)    All other components within normal limits  URINALYSIS, ROUTINE W REFLEX MICROSCOPIC - Abnormal; Notable for the following components:   Color, Urine AMBER (*)    Nitrite POSITIVE (*)    Leukocytes,Ua TRACE (*)    All other components within normal limits  LIPASE, BLOOD  I-STAT BETA HCG BLOOD, ED ([facility], [facility], [facility] ONLY)    EKG None  Radiology No results found.  Procedures Procedures (including critical care time)  Medications Ordered in ED Medications  pantoprazole (PROTONIX) EC tablet 80 mg (80 mg Oral Given 08/18/19 2034)     Initial Impression / Assessment and Plan / ED Course  I have reviewed the triage vital signs and the nursing notes.  Pertinent labs & imaging results that were available during my care of the patient were reviewed by me and considered in my medical decision making (see chart for details).      Patient's laboratory tests have been reviewed.  Urinalysis shows positive nitrite trace leukocyte esterase but 0-5 white blood cells RBCs and no bacteria.  She does not have any urinary symptoms.  I doubt UTI.  Electrolyte panel is unremarkable.  LFTs and lipase are unremarkable.  She does have a mild leukocytosis.  Patient however just had a CT  scan earlier today that did not show any acute findings other than constipation.  She has been dealing with this issue since June.  I doubt any acute emergency medical condition.  I do think would be reasonable for her to try taking the MiraLAX that was prescribed at the other emergency room.  I do think she  can safely follow-up with her GI doctor.    Final Clinical Impressions(s) / ED Diagnoses   Final diagnoses:  Abdominal pain, unspecified abdominal location  Constipation, unspecified constipation type    ED Discharge Orders    None       Linwood DibblesKnapp, Haile Bosler, MD 08/18/19 2042

## 2019-08-18 NOTE — ED Notes (Signed)
Patient discharge instructions reviewed. Opportunity for questions and concerns allowed with none voiced. Patient stable and ambulatory at time of discharge.

## 2019-08-18 NOTE — ED Triage Notes (Signed)
Pt reports that she has ben having issues with her stomach since June. Last week had endoscsopy and colonoscopy. Reports nausea and abd pains. Was seen at [facility] ED earlier and had CT scan per patient but they wouldn't/couldn't due anything else for her.

## 2019-08-18 NOTE — ED Notes (Signed)
Patient states that she is ready to go because she was seen in the ED last night and everything was fine. Patient states that she will just take a laxative when she gets home and it will fix everything. Patient getting dressed currently and called ride to pick her up. EDP made aware.

## 2019-09-04 ENCOUNTER — Encounter (HOSPITAL_COMMUNITY): Payer: Self-pay | Admitting: *Deleted

## 2019-09-04 ENCOUNTER — Other Ambulatory Visit: Payer: Self-pay

## 2019-09-04 ENCOUNTER — Emergency Department (HOSPITAL_COMMUNITY)
Admission: EM | Admit: 2019-09-04 | Discharge: 2019-09-04 | Disposition: A | Discharge status: Home / Self Care | Payer: Self-pay | Attending: Emergency Medicine | Admitting: Emergency Medicine

## 2019-09-04 ENCOUNTER — Emergency Department (HOSPITAL_COMMUNITY): Payer: Self-pay

## 2019-09-04 DIAGNOSIS — F17210 Nicotine dependence, cigarettes, uncomplicated: Secondary | ICD-10-CM | POA: Insufficient documentation

## 2019-09-04 DIAGNOSIS — K224 Dyskinesia of esophagus: Secondary | ICD-10-CM | POA: Insufficient documentation

## 2019-09-04 HISTORY — DX: Gastro-esophageal reflux disease without esophagitis: K21.9

## 2019-09-04 LAB — BASIC METABOLIC PANEL
Anion gap: 12 (ref 5–15)
BUN: 10 mg/dL (ref 6–20)
CO2: 24 mmol/L (ref 22–32)
Calcium: 9 mg/dL (ref 8.9–10.3)
Chloride: 101 mmol/L (ref 98–111)
Creatinine, Ser: 0.69 mg/dL (ref 0.44–1.00)
GFR calc Af Amer: >60 mL/min (ref >60)
GFR calc non Af Amer: >60 mL/min (ref >60)
Glucose, Bld: 105 mg/dL — ABNORMAL HIGH (ref 70–99)
Potassium: 4.2 mmol/L (ref 3.5–5.1)
Sodium: 137 mmol/L (ref 135–145)

## 2019-09-04 LAB — CBC
HCT: 45 % (ref 36.0–46.0)
Hemoglobin: 14.8 g/dL (ref 12.0–15.0)
MCH: 31.8 pg (ref 26.0–34.0)
MCHC: 32.9 g/dL (ref 30.0–36.0)
MCV: 96.8 fL (ref 80.0–100.0)
Platelets: 376 10*3/uL (ref 150–400)
RBC: 4.65 MIL/uL (ref 3.87–5.11)
RDW: 13.2 % (ref 11.5–15.5)
WBC: 8.6 10*3/uL (ref 4.0–10.5)
nRBC: 0 % (ref 0.0–0.2)

## 2019-09-04 LAB — TROPONIN I (HIGH SENSITIVITY): Troponin I (High Sensitivity): 3 ng/L (ref <18)

## 2019-09-04 MED ORDER — LORAZEPAM 1 MG PO TABS: 1.0000 mg | ORAL_TABLET | Freq: Every day | ORAL | 0 refills | Status: AC

## 2019-09-04 MED ORDER — LORAZEPAM 2 MG/ML IJ SOLN
0.5000 mg | Freq: Once | INTRAMUSCULAR | Status: AC
  Administered 2019-09-04: 15:00:00 0.5 mg via INTRAVENOUS
  Filled 2019-09-04: qty 1

## 2019-09-04 MED ORDER — SODIUM CHLORIDE 0.9% FLUSH: 3.0000 mL | Freq: Once | INTRAVENOUS | Status: DC

## 2019-09-04 NOTE — ED Triage Notes (Signed)
Patient presents to ed via [facility] states she started having central chest pain onset MN states she has a history of GERD however this doesn't feel like her GERD. C/o nausea no vomiting. States she used her GERD med and states it didn't help. Also c/o bright red rectal bleeding this am. Has seen or stomach issues before. PT was given asa 324 mg and NTG x 1 per ems, states didn't help with the pain. Patient states she feels like she can't get a deep enough breath in.

## 2019-09-04 NOTE — ED Provider Notes (Signed)
[facility]   CSN: [id number] Arrival date & time: 09/04/19  1308     History   Chief Complaint Chief Complaint  Patient presents with  . Chest Pain    HPI [name] is a 58 y.o. female with a history of anxiety and reflux, presented to emergency department complaining of chest pain and choking sensation.  She reports she has been having these ongoing symptoms ever since July, when she ate a "bad hamburger" and began having "stomach pain all the time."  She states she was subsequently seen by GI and had an upper endoscopy done.  She reports she was diagnosed with GERD and started on PPI.  However, she continues to have discomfort with eating foods.  Her symptoms are always worse at night when in bed.    Last night she began having severe symptoms, which she describes a pressure and burning sensation in her chest. This is associated with a choking sensation.  This morning she felt that she couldn't breathe or get in air, so called EMS.  Per report, EMS gave the patient a full dose of aspirin and nitroglycerin and brought her to the ED.    She reports to me that she has no prior history of cardiac disease including MI or stents.  She has no significant family history of cardiac disease.  She is not a smoker.  She denies any history of hypertension or diabetes or high cholesterol.  She denies any cough, congestion, fevers, chills, sick contacts in the house.  She denies any personal history of blood clots.  She reports she has her next GI appointment on October 1 but does not feel she can wait that long with her current symptoms.    HPI  Past Medical History:  Diagnosis Date  . Anxiety   . Chronic GERD     There are no active problems to display for this patient.   History reviewed. No pertinent surgical history.   OB History   No obstetric history on file.      Home Medications    Prior to Admission  medications   Medication Sig Start Date End Date Taking? Authorizing Provider  acetaminophen (TYLENOL) 500 MG tablet Take 1,000 mg by mouth every 6 (six) hours as needed for moderate pain or headache.   Yes [provider]  famotidine (PEPCID) 20 MG tablet Take 20 mg by mouth 2 (two) times daily.   Yes [provider]  Melatonin 3 MG TABS Take 1 tablet by mouth at bedtime as needed (sleep).   Yes [provider]  omeprazole (PRILOSEC) 40 MG capsule Take 40 mg by mouth 2 (two) times daily.  08/04/19  Yes [provider]  sucralfate (CARAFATE) 1 g tablet Take 1 g by mouth 4 (four) times daily -  with meals and at bedtime.   Yes [provider]  LORazepam (ATIVAN) 1 MG tablet Take 1 tablet (1 mg total) by mouth at bedtime for 8 doses. 09/04/19 09/12/19  Terald Sleeperrifan, Samanth Mirkin J, MD    Family History No family history on file.  Social History Social History   Tobacco Use  . Smoking status: Current Every Day Smoker    Packs/day: 1.00    Years: 35.00    Pack years: 35.00    Types: Cigarettes  . Smokeless tobacco: Never Used  Substance Use Topics  . Alcohol use: No  . Drug use: Never     Allergies  Patient has no known allergies.   Review of Systems Review of Systems  Constitutional: Negative for chills and fever.  HENT: Negative for drooling, facial swelling, sore throat and voice change.   Respiratory: Positive for choking and chest tightness. Negative for cough and shortness of breath.   Cardiovascular: Positive for chest pain. Negative for palpitations.  Gastrointestinal: Positive for abdominal pain and nausea. Negative for vomiting.  Musculoskeletal: Negative for arthralgias and neck pain.  Neurological: Negative for syncope and light-headedness.  Psychiatric/Behavioral: Positive for sleep disturbance. The patient is nervous/anxious.   All other systems reviewed and are negative.    Physical Exam Updated Vital Signs BP 120/81   Pulse  91   Temp (!) 82 F (27.8 C)   Resp 16   Ht 5\' 3"  (1.6 m)   Wt 66.7 kg   SpO2 98%   BMI 26.04 kg/m   Physical Exam Vitals signs and nursing note reviewed.  Constitutional:      General: She is not in acute distress.    Appearance: She is well-developed.     Comments: Appears quite anxious  HENT:     Head: Normocephalic and atraumatic.     Comments: Oropharynx clear with no sign of tongue, lip, or uvula swelling Voice is clear Eyes:     Conjunctiva/sclera: Conjunctivae normal.  Neck:     Musculoskeletal: Normal range of motion and neck supple.  Cardiovascular:     Rate and Rhythm: Normal rate and regular rhythm.     Heart sounds: No murmur.  Pulmonary:     Effort: Pulmonary effort is normal. No respiratory distress.     Breath sounds: Normal breath sounds.  Abdominal:     Palpations: Abdomen is soft.     Tenderness: There is no abdominal tenderness.  Skin:    General: Skin is warm and dry.  Neurological:     General: No focal deficit present.     Mental Status: She is alert and oriented to person, place, and time.      ED Treatments / Results  Labs (all labs ordered are listed, but only abnormal results are displayed) Labs Reviewed  BASIC METABOLIC PANEL - Abnormal; Notable for the following components:      Result Value   Glucose, Bld 105 (*)    All other components within normal limits  CBC  TROPONIN I (HIGH SENSITIVITY)    EKG EKG Interpretation  Date/Time:  Friday September 04 2019 13:08:43 EDT Ventricular Rate:  97 PR Interval:    QRS Duration: 102 QT Interval:  373 QTC Calculation: 474 R Axis:   90 Text Interpretation:  Sinus rhythm No STEMI  Confirmed by Alvester Chou [phone]) on 09/04/2019 1:35:26 PM   Radiology Dg Chest 2 View  Result Date: 09/04/2019 CLINICAL DATA:  Chest pain, shortness of breath. EXAM: CHEST - 2 VIEW COMPARISON:  Radiographs and CT scan of March 09, 2019. FINDINGS: The heart size and mediastinal contours are within  normal limits. Both lungs are clear. No pneumothorax or pleural effusion is noted. The visualized skeletal structures are unremarkable. IMPRESSION: No active cardiopulmonary disease. Electronically Signed   By: Lupita Raider M.D.   On: 09/04/2019 13:41    Procedures Procedures (including critical care time)  Medications Ordered in ED Medications  LORazepam (ATIVAN) injection 0.5 mg (0.5 mg Intravenous Given 09/04/19 1455)     Initial Impression / Assessment and Plan / ED Course  I have reviewed the triage vital signs and the nursing notes.  Pertinent labs & imaging results that were available during my care of the patient were reviewed by me and considered in my medical decision making (see chart for details).  Patient is a 58 year old female history of GERD and acid reflux of aspirin plaguing her for several months, presented to the emergency department with epigastric pain and a choking sensation.  Her symptoms are most consistent with reflux and possible esophageal spasm.  She describes him as worst at night when she is lying flat and also after eating food.  She has a subjective sensation that she cannot breathe and that she is choking and having a hard time getting an air.  My exam to demonstrates no objective signs of respiratory distress, has no evidence of airway swelling to suggest an allergic reaction.  I suspect there is a strong anxiety component.  Of much lower suspicion for acute coronary syndrome.  She has none of the traditional cardiac risk factors.  However she has never had a cardiac work-up so I do believe is reasonable to need a chest x-ray and troponin levels.  If these are unremarkable I would not pursue any further cardiac evaluation in the ED.  Regarding her symptoms, I will give her a small dose of Ativan.  I suspect this will both help with her anxiety and her subjective choking sensation, as well as possible smooth muscle relaxation if this is indeed an esophageal  spasm issue.  Ultimately, we discussed the importance of following up with gastroenterology, and she is understanding of this and has an appointment on October 1.   Clinical Course as of Sep 03 1922  Fri Sep 04, 2019  1435 Negative troponin   [MT]  1512 Patient just received her half milligram of Ativan.  Her labs are unremarkable.  I have a very low suspicion for acute coronary syndrome.  She is tolerating p.o. and drinking and eating crackers.  Will reassess her within the next hour to see if the Ativan has helped her symptoms.   [MT]  7824 Patient reports significant improvement of her chest pain and symptoms after receiving half a milligram of Ativan.  This may related to anxiety, however Ativan is also an option for muscle spasm such as simply be treating underlying cause.  I told her be willing to prescribe her a very short course of Ativan to tide her over until she is able to see her GI doctor for more definitive treatment.  She is grateful for this.  Will discharge.   [MT]    Clinical Course User Index [MT] Timarie Labell, Carola Rhine, MD    Final Clinical Impressions(s) / ED Diagnoses   Final diagnoses:  Esophageal spasm    ED Discharge Orders         Ordered    LORazepam (ATIVAN) 1 MG tablet  Daily at bedtime     09/04/19 1600           Wyvonnia Dusky, MD 09/04/19 1924

## 2019-09-04 NOTE — Discharge Instructions (Addendum)
Please follow up with your GI doctor as scheduled.

## 2019-09-04 NOTE — ED Notes (Signed)
Patient transported to X-ray 

## 2019-09-14 ENCOUNTER — Other Ambulatory Visit: Payer: Self-pay

## 2019-09-14 ENCOUNTER — Emergency Department (HOSPITAL_COMMUNITY)
Admission: EM | Admit: 2019-09-14 | Discharge: 2019-09-14 | Disposition: A | Discharge status: Home / Self Care | Payer: No Typology Code available for payment source | Attending: Emergency Medicine | Admitting: Emergency Medicine

## 2019-09-14 ENCOUNTER — Encounter (HOSPITAL_COMMUNITY): Payer: Self-pay

## 2019-09-14 ENCOUNTER — Emergency Department (HOSPITAL_COMMUNITY): Payer: No Typology Code available for payment source

## 2019-09-14 DIAGNOSIS — F17210 Nicotine dependence, cigarettes, uncomplicated: Secondary | ICD-10-CM | POA: Insufficient documentation

## 2019-09-14 DIAGNOSIS — Z79899 Other long term (current) drug therapy: Secondary | ICD-10-CM | POA: Diagnosis not present

## 2019-09-14 DIAGNOSIS — R0789 Other chest pain: Secondary | ICD-10-CM | POA: Diagnosis present

## 2019-09-14 DIAGNOSIS — F419 Anxiety disorder, unspecified: Secondary | ICD-10-CM | POA: Diagnosis not present

## 2019-09-14 DIAGNOSIS — R1084 Generalized abdominal pain: Secondary | ICD-10-CM | POA: Insufficient documentation

## 2019-09-14 LAB — CBC
HCT: 43.2 % (ref 36.0–46.0)
Hemoglobin: 14.7 g/dL (ref 12.0–15.0)
MCH: 32.7 pg (ref 26.0–34.0)
MCHC: 34 g/dL (ref 30.0–36.0)
MCV: 96.2 fL (ref 80.0–100.0)
Platelets: 381 10*3/uL (ref 150–400)
RBC: 4.49 MIL/uL (ref 3.87–5.11)
RDW: 13.1 % (ref 11.5–15.5)
WBC: 7.9 10*3/uL (ref 4.0–10.5)
nRBC: 0 % (ref 0.0–0.2)

## 2019-09-14 LAB — BASIC METABOLIC PANEL
Anion gap: 9 (ref 5–15)
BUN: 6 mg/dL (ref 6–20)
CO2: 27 mmol/L (ref 22–32)
Calcium: 8.8 mg/dL — ABNORMAL LOW (ref 8.9–10.3)
Chloride: 103 mmol/L (ref 98–111)
Creatinine, Ser: 0.63 mg/dL (ref 0.44–1.00)
GFR calc Af Amer: >60 mL/min (ref >60)
GFR calc non Af Amer: >60 mL/min (ref >60)
Glucose, Bld: 99 mg/dL (ref 70–99)
Potassium: 4.5 mmol/L (ref 3.5–5.1)
Sodium: 139 mmol/L (ref 135–145)

## 2019-09-14 LAB — I-STAT BETA HCG BLOOD, ED (MC, WL, AP ONLY): I-stat hCG, quantitative: <5 m[IU]/mL (ref <5)

## 2019-09-14 LAB — TROPONIN I (HIGH SENSITIVITY)
Troponin I (High Sensitivity): 4 ng/L (ref <18)
Troponin I (High Sensitivity): 3 ng/L (ref <18)

## 2019-09-14 MED ORDER — SODIUM CHLORIDE 0.9% FLUSH: 3.0000 mL | Freq: Once | INTRAVENOUS | Status: DC

## 2019-09-14 NOTE — Discharge Instructions (Signed)
Please follow-up with your PCP regarding your anxiety and your abdominal pain. Your EKG, CXR, and lab work today were reassuring.

## 2019-09-14 NOTE — ED Triage Notes (Signed)
Pt from home with ems for anxiety, CP and SOB, takes lexapro. Pt a.o, nad noted at this time.   112/74 P 90

## 2019-09-14 NOTE — ED Notes (Signed)
Pt verbalized understanding of discharge paperwork and follow-up care.  °

## 2019-09-14 NOTE — ED Provider Notes (Signed)
[facility] Provider [facility]   CSN: [id number] Arrival date & time: 09/14/19  1236     History   Chief Complaint Chief Complaint  Patient presents with  . Anxiety  . Chest Pain  . Shortness of Breath    HPI [name] is a 58 y.o. female.  HPI: A 58 year old patient presents for evaluation of chest pain. Initial onset of pain was more than 6 hours ago. The patient's chest pain is described as heaviness/pressure/tightness and is not worse with exertion. The patient's chest pain is not middle- or left-sided, is not well-localized, is not sharp and does not radiate to the arms/jaw/neck. The patient does not complain of nausea and denies diaphoresis. The patient has smoked in the past 90 days. The patient has no history of stroke, has no history of peripheral artery disease, denies any history of treated diabetes, has no relevant family history of coronary artery disease (first degree relative at less than age 58), is not hypertensive, has no history of hypercholesterolemia and does not have an elevated BMI (>=30).   The history is provided by the patient.  Chest Pain Pain location:  Unable to specify Pain quality: tightness   Timing:  Intermittent Context: breathing and stress   Context: not movement   Relieved by:  None tried Worsened by:  Nothing Ineffective treatments:  Rest Associated symptoms: anxiety, palpitations and shortness of breath   Associated symptoms: no altered mental status, no back pain, no diaphoresis, no fatigue, no fever, no heartburn, no nausea, no numbness and no syncope   Risk factors: smoking   Risk factors: no coronary artery disease, no diabetes mellitus, no high cholesterol, no hypertension, not female and not obese      The patient reports a history of anxiety for which she was recently started on Lexapro by her PCP. She presents today with chest tightness and shortness of breath that started last night before bed  and caused her to stay up all night. It persisted until the AM when she called EMS. Her symptoms felt similar to prior episodes of anxiety. Her chest tightness was not worse with exertion and has since resolved. The patient denies a hx of cardiac problems and reports a last stress test 5 years ago.   She is a regular daily smoker <1ppd and has been trying to cut back. She additionally has been experiencing intermittent abdominal pain and diarrhea over the past few months. She states that she had her stool evaluated by a veternarian under a microscope and was told that she might have tapeworms and other parasites in her stool. Her PCP sent [facility] stool [facility] and parasite [facility] which is currently pending.  Past Medical History:  Diagnosis Date  . Anxiety   . Chronic GERD     There are no active problems to display for this patient.   History reviewed. No pertinent surgical history.   OB History   No obstetric history on file.      Home Medications    Prior to Admission medications   Medication Sig Start Date End Date Taking? Authorizing Provider  acetaminophen (TYLENOL) 500 MG tablet Take 1,000 mg by mouth every 6 (six) hours as needed for moderate pain or headache.    [provider]  famotidine (PEPCID) 20 MG tablet Take 20 mg by mouth 2 (two) times daily.    [provider]  Melatonin 3 MG TABS Take 1 tablet by mouth at  bedtime as needed (sleep).    [provider]  omeprazole (PRILOSEC) 40 MG capsule Take 40 mg by mouth 2 (two) times daily.  08/04/19   [provider]  sucralfate (CARAFATE) 1 g tablet Take 1 g by mouth 4 (four) times daily -  with meals and at bedtime.    [provider]    Family History No family history on file.  Social History Social History   Tobacco Use  . Smoking status: Current Every Day Smoker    Packs/day: 1.00    Years: 35.00    Pack years: 35.00    Types: Cigarettes  . Smokeless tobacco: Never  Used  Substance Use Topics  . Alcohol use: No  . Drug use: Never     Allergies   Patient has no known allergies.   Review of Systems Review of Systems  Constitutional: Negative for diaphoresis, fatigue and fever.  Respiratory: Positive for shortness of breath.   Cardiovascular: Positive for chest pain and palpitations. Negative for syncope.  Gastrointestinal: Negative for heartburn and nausea.  Musculoskeletal: Negative for back pain.  Neurological: Negative for numbness.     Physical Exam Updated Vital Signs BP (!) 151/94 (BP Location: Right Arm)   Pulse 96   Temp 98.3 F (36.8 C) (Oral)   Resp 18   SpO2 97%   Physical Exam Vitals signs and nursing note reviewed.  Constitutional:      General: She is not in acute distress.    Appearance: She is well-developed.  HENT:     Head: Normocephalic and atraumatic.  Eyes:     Conjunctiva/sclera: Conjunctivae normal.  Neck:     Musculoskeletal: Neck supple.  Cardiovascular:     Rate and Rhythm: Normal rate and regular rhythm.     Heart sounds: Normal heart sounds. No murmur.  Pulmonary:     Effort: Pulmonary effort is normal. No respiratory distress.     Breath sounds: Normal breath sounds.  Abdominal:     Palpations: Abdomen is soft.     Tenderness: There is generalized abdominal tenderness.     Comments: Mild diffuse TTP  Skin:    General: Skin is warm and dry.  Neurological:     Mental Status: She is alert.      ED Treatments / Results  Labs (all labs ordered are listed, but only abnormal results are displayed) Labs Reviewed  BASIC METABOLIC PANEL - Abnormal; Notable for the following components:      Result Value   Calcium 8.8 (*)    All other components within normal limits  CBC  I-STAT BETA HCG BLOOD, ED ([facility], [facility], [facility] ONLY)  TROPONIN I (HIGH SENSITIVITY)  TROPONIN I (HIGH SENSITIVITY)    EKG EKG Interpretation  Date/Time:  Monday September 14 2019 12:43:27 EDT Ventricular Rate:  92 PR  Interval:  124 QRS Duration: 90 QT Interval:  408 QTC Calculation: 504 R Axis:   43 Text Interpretation:  Sinus rhythm Premature ventricular complexes Artifact poor study, abnormal Confirmed by Carmin Muskrat [id number]) on 09/14/2019 4:11:19 PM   Radiology Dg Chest 2 View  Result Date: 09/14/2019 CLINICAL DATA:  Chest pain and shortness of breath EXAM: CHEST - 2 VIEW COMPARISON:  September 04, 2019 FINDINGS: There is no edema or consolidation. Heart size and pulmonary vascularity are normal. No adenopathy. No pneumothorax. There is mild degenerative change in the thoracic spine. IMPRESSION: No edema or consolidation. Electronically Signed   By: Lowella Grip III M.D.   On:  09/14/2019 12:58    Procedures Procedures (including critical care time)  Medications Ordered in ED Medications - No data to display   Initial Impression / Assessment and Plan / ED Course  I have reviewed the triage vital signs and the nursing notes.  Pertinent labs & imaging results that were available during my care of the patient were reviewed by me and considered in my medical decision making (see chart for details).     HEAR Score: 2  The patient arrives to the ED afebrile, HDS, in NAD. She complains of chest tightness and shortness of breath consistent with prior episodes of anxiety. Her EKG showed sinus rhythm and prolonged QTc but no anatomic ST-T changes consistent with ischemia. Troponins x2 were negative. A CXR was performed without edema on consolidation. Hcg, CBC and BMP were normal. The patient has a HEAR score of 2. I do not think her CP is cardiac in nature. While PERC positive, Wells low risk. Low concern for PE. Her pain and shortness of breath have resolved since coming to the ED. I think her symptoms are consistent with anxiety. She is currently taking Lexapro. I told her to continue the medication as prescribed and to try melatonin at home for insomnia. Her abdominal exam was benign. Recommended  that she follow-up with her PCP regarding her symptoms.  Final Clinical Impressions(s) / ED Diagnoses   Final diagnoses:  Anxiety  Generalized abdominal pain    ED Discharge Orders    None       Ernie Avena, MD 09/14/19 1741    Gerhard Munch, MD 09/16/19 2126

## 2019-09-20 ENCOUNTER — Other Ambulatory Visit: Payer: Self-pay

## 2019-09-20 ENCOUNTER — Emergency Department (HOSPITAL_COMMUNITY)
Admission: EM | Admit: 2019-09-20 | Discharge: 2019-09-22 | Disposition: A | Discharge status: Other Acute Inpatient Hospital | Payer: PRIVATE HEALTH INSURANCE | Attending: Emergency Medicine | Admitting: Emergency Medicine

## 2019-09-20 ENCOUNTER — Encounter (HOSPITAL_COMMUNITY): Payer: Self-pay

## 2019-09-20 DIAGNOSIS — F333 Major depressive disorder, recurrent, severe with psychotic symptoms: Secondary | ICD-10-CM | POA: Diagnosis not present

## 2019-09-20 DIAGNOSIS — F329 Major depressive disorder, single episode, unspecified: Secondary | ICD-10-CM | POA: Diagnosis present

## 2019-09-20 DIAGNOSIS — R4589 Other symptoms and signs involving emotional state: Secondary | ICD-10-CM

## 2019-09-20 DIAGNOSIS — F17210 Nicotine dependence, cigarettes, uncomplicated: Secondary | ICD-10-CM | POA: Insufficient documentation

## 2019-09-20 DIAGNOSIS — Z79899 Other long term (current) drug therapy: Secondary | ICD-10-CM | POA: Diagnosis not present

## 2019-09-20 DIAGNOSIS — R45851 Suicidal ideations: Secondary | ICD-10-CM | POA: Insufficient documentation

## 2019-09-20 DIAGNOSIS — Z20828 Contact with and (suspected) exposure to other viral communicable diseases: Secondary | ICD-10-CM | POA: Insufficient documentation

## 2019-09-20 DIAGNOSIS — Z008 Encounter for other general examination: Secondary | ICD-10-CM | POA: Insufficient documentation

## 2019-09-20 DIAGNOSIS — F419 Anxiety disorder, unspecified: Secondary | ICD-10-CM | POA: Diagnosis not present

## 2019-09-20 LAB — COMPREHENSIVE METABOLIC PANEL
ALT: 15 U/L (ref 0–44)
AST: 22 U/L (ref 15–41)
Albumin: 4.1 g/dL (ref 3.5–5.0)
Alkaline Phosphatase: 72 U/L (ref 38–126)
Anion gap: 11 (ref 5–15)
BUN: 12 mg/dL (ref 6–20)
CO2: 24 mmol/L (ref 22–32)
Calcium: 9 mg/dL (ref 8.9–10.3)
Chloride: 98 mmol/L (ref 98–111)
Creatinine, Ser: 0.52 mg/dL (ref 0.44–1.00)
GFR calc Af Amer: >60 mL/min (ref >60)
GFR calc non Af Amer: >60 mL/min (ref >60)
Glucose, Bld: 117 mg/dL — ABNORMAL HIGH (ref 70–99)
Potassium: 4.1 mmol/L (ref 3.5–5.1)
Sodium: 133 mmol/L — ABNORMAL LOW (ref 135–145)
Total Bilirubin: 0.3 mg/dL (ref 0.3–1.2)
Total Protein: 7.1 g/dL (ref 6.5–8.1)

## 2019-09-20 LAB — RAPID URINE DRUG SCREEN, HOSP PERFORMED
Amphetamines: NOT DETECTED
Barbiturates: NOT DETECTED
Benzodiazepines: NOT DETECTED
Cocaine: NOT DETECTED
Opiates: NOT DETECTED
Tetrahydrocannabinol: NOT DETECTED

## 2019-09-20 LAB — CBC
HCT: 42.8 % (ref 36.0–46.0)
Hemoglobin: 13.9 g/dL (ref 12.0–15.0)
MCH: 31.7 pg (ref 26.0–34.0)
MCHC: 32.5 g/dL (ref 30.0–36.0)
MCV: 97.7 fL (ref 80.0–100.0)
Platelets: 375 10*3/uL (ref 150–400)
RBC: 4.38 MIL/uL (ref 3.87–5.11)
RDW: 13.1 % (ref 11.5–15.5)
WBC: 9.4 10*3/uL (ref 4.0–10.5)
nRBC: 0 % (ref 0.0–0.2)

## 2019-09-20 LAB — ETHANOL: Alcohol, Ethyl (B): <10 mg/dL (ref <10)

## 2019-09-20 MED ORDER — LORAZEPAM 1 MG PO TABS
1.0000 mg | ORAL_TABLET | Freq: Once | ORAL | Status: AC
  Administered 2019-09-20: 1 mg via ORAL
  Filled 2019-09-20: qty 1

## 2019-09-20 MED ORDER — FAMOTIDINE 20 MG PO TABS
20.0000 mg | ORAL_TABLET | Freq: Once | ORAL | Status: AC
  Administered 2019-09-20: 20 mg via ORAL
  Filled 2019-09-20: qty 1

## 2019-09-20 MED ORDER — PANTOPRAZOLE SODIUM 40 MG PO TBEC
40.0000 mg | DELAYED_RELEASE_TABLET | Freq: Every day | ORAL | Status: DC
  Administered 2019-09-20 – 2019-09-21 (×2): 40 mg via ORAL
  Filled 2019-09-20 (×2): qty 1

## 2019-09-20 MED ORDER — CALCIUM CARBONATE ANTACID 500 MG PO CHEW
1.0000 | CHEWABLE_TABLET | Freq: Once | ORAL | Status: AC
  Administered 2019-09-20: 20:00:00 200 mg via ORAL
  Filled 2019-09-20: qty 1

## 2019-09-20 MED ORDER — LORAZEPAM 1 MG PO TABS
1.0000 mg | ORAL_TABLET | Freq: Once | ORAL | Status: AC
  Administered 2019-09-20: 21:00:00 1 mg via ORAL
  Filled 2019-09-20: qty 1

## 2019-09-20 MED ORDER — ALUM & MAG HYDROXIDE-SIMETH 200-200-20 MG/5ML PO SUSP
15.0000 mL | Freq: Once | ORAL | Status: AC
  Administered 2019-09-20: 15 mL via ORAL
  Filled 2019-09-20: qty 30

## 2019-09-20 NOTE — ED Notes (Signed)
Pt has been seen and wand by security.  Pt hs 1 purse and 1 bag of belongings.

## 2019-09-20 NOTE — ED Triage Notes (Signed)
Pt is SI with a plan. Pt recently restarted lexapro for depression. Pt is concerned that she has worms. Upon EMS arrival, pt was walking around with knife, with the intention of stabbing herself and cutting her wrists. Pt states that she had seen worms under a microscope at the vets, and was given dewormer. Pt states she is tired of being miserable. Pt states all MD offices have told her she does not have worms. Pt states she has only been sleeping 3-4 hours per night for months.

## 2019-09-20 NOTE — ED Notes (Signed)
Pt denies any SI, states "Oh yeah, I shouldn't have said that, I didn't mean it" Pt now has a family member at bedside

## 2019-09-20 NOTE — BH Assessment (Addendum)
Tele Assessment Note   Patient Name: SHANNA UN MRN: [id number] Referring Physician: Lynden Oxford, MD Location of Patient: [facility] ED, [phone] Location of Provider: Behavioral Health TTS Department  NANDIKA STETZER is an 58 y.o. divorced female who presents unaccompanied to [facility] ED via EMS reporting symptoms of depression, anxiety and suicidal ideation. Per medical record, upon EMS arrival to Pt's residence, pt was walking around with knife, with the intention of stabbing herself and cutting her wrists. Pt says she has presented to the ED recently due to anxiety and being infected with worms. Pt says she has had digestive problems since eating a hamburger in June. She says she went to a International aid/development worker and and saw works in her stool under a microscope. She reports she has been to a gastroenterologist who said she doesn't have worms but she doesn't believe that. She says she is taking de-worming medication that she received from a International aid/development worker.   Pt says she has been exhausted and frustrated due to her medical issues including fatigue, acid reflux and stomach discomfort. She says she sleeps 1-3 hours. She describes her mood as "down and out". Pt acknowledges symptoms including social withdrawal, loss of interest in usual pleasures, fatigue, irritability and feelings of frustration. She says she has panic attacks 1-2 times per week and has been going to the ED. She acknowledges suicidal ideation earlier today but denies current suicidal thoughts, stating that she shouldn't have said she was suicidal "because now I'm back in this room." Pt denies any history of intentional self-injurious behaviors. Pt denies current homicidal ideation or history of violence. Pt denies any history of auditory or visual hallucinations. Pt denies history of alcohol or other substance use.  Pt identifies her medical concerns as her primary stressor. 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 She says her 15 year old son, who lives is [place], is very supportive and calls her daily. She says her ex-husband has also bee supportive. She denies history of abuse or trauma. She denies legal problems. She denies access to firearms. Pt says she has no outpatient mental health providers. She denies any history of inpatient psychiatric treatment.  Pt is dressed in hospital scrubs, alert and oriented x4. Pt speaks in a clear tone, at moderate volume and rapid pace. Motor behavior appears normal. Eye contact is good. Pt's mood is depressed and anxious, affect is congruent with mood. Thought process is coherent and relevant. There is no indication Pt is currently responding to internal stimuli. Pt was cooperative throughout assessment.     Diagnosis: F33.3 Major depressive disorder, Recurrent episode, With psychotic features  Past Medical History:  Past Medical History:  Diagnosis Date  . Anxiety   . Chronic GERD     History reviewed. No pertinent surgical history.  Family History: No family history on file.  Social History:  reports that she has been smoking cigarettes. She has a 35.00 pack-year smoking history. She has never used smokeless tobacco. She reports that she does not drink alcohol or use drugs.  Additional Social History:  Alcohol / Drug Use Pain Medications: Denies abuse Prescriptions: Denies abuse Over the Counter: Denies abuse History of alcohol / drug use?: No history of alcohol / drug abuse Longest period of sobriety (when/how long): NA  CIWA: CIWA-Ar BP: 116/72 Pulse Rate: 72 COWS:    Allergies: No Known Allergies  Home Medications: (Not in a hospital admission)   OB/GYN Status:  No LMP recorded. (Menstrual status: Other).  General Assessment Data Location of Assessment: [facility] ED TTS Assessment: In system Is this a Tele or Face-to-Face Assessment?: Tele Assessment Is this an Initial Assessment or a Re-assessment for  this encounter?: Initial Assessment Patient Accompanied by:: N/A Language Other than English: No Living Arrangements: Other (Comment)(Lives alone) What gender do you identify as?: Female Marital status: Divorced [place] name: NA Pregnancy Status: No Living Arrangements: Alone Can pt return to current living arrangement?: Yes Admission Status: Voluntary Is patient capable of signing voluntary admission?: Yes Referral Source: Self/Family/Friend Insurance type: Actor Care Plan Living Arrangements: Alone Legal Guardian: Other:(Self) Name of Psychiatrist: None Name of Therapist: None  Education Status Is patient currently in school?: No Is the patient employed, unemployed or receiving disability?: Employed  Risk to self with the past 6 months Suicidal Ideation: Yes-Currently Present Has patient been a risk to self within the past 6 months prior to admission? : Yes Suicidal Intent: No Has patient had any suicidal intent within the past 6 months prior to admission? : Yes Is patient at risk for suicide?: Yes Suicidal Plan?: Yes-Currently Present Has patient had any suicidal plan within the past 6 months prior to admission? : Yes Specify Current Suicidal Plan: Cut herself with knife Access to Means: Yes Specify Access to Suicidal Means: Pt had knife in hand today What has been your use of drugs/alcohol within the last 12 months?: Pt denies Previous Attempts/Gestures: No How many times?: 0 Other Self Harm Risks: None Triggers for Past Attempts: None known Intentional Self Injurious Behavior: None Family Suicide History: No Recent stressful life event(s): Other (Comment)(Medical problems) Persecutory voices/beliefs?: No Depression: Yes Depression Symptoms: Despondent, Isolating, Fatigue, Loss of interest in usual pleasures, Feeling angry/irritable Substance abuse history and/or treatment for substance abuse?: No Suicide prevention information given to  non-admitted patients: Not applicable  Risk to Others within the past 6 months Homicidal Ideation: No Does patient have any lifetime risk of violence toward others beyond the six months prior to admission? : No Thoughts of Harm to Others: No Current Homicidal Intent: No Current Homicidal Plan: No Access to Homicidal Means: No Identified Victim: None History of harm to others?: No Assessment of Violence: None Noted Violent Behavior Description: Pt denies history of violence Does patient have access to weapons?: No Criminal Charges Pending?: No Does patient have a court date: No Is patient on probation?: No  Psychosis Hallucinations: None noted Delusions: Somatic  Mental Status Report Appearance/Hygiene: In scrubs Eye Contact: Good Motor Activity: Unremarkable Speech: Logical/coherent, Rapid Level of Consciousness: Alert Mood: Anxious, Depressed Affect: Anxious, Depressed Anxiety Level: Panic Attacks Panic attack frequency: 1-2 per week Most recent panic attack: Today Thought Processes: Coherent, Relevant Judgement: Partial Orientation: Person, Place, Time, Situation Obsessive Compulsive Thoughts/Behaviors: Moderate  Cognitive Functioning Concentration: Normal Memory: Recent Intact, Remote Intact Is patient IDD: No Insight: Fair Impulse Control: Fair Appetite: Good Have you had any weight changes? : No Change Sleep: Decreased Total Hours of Sleep: 3 Vegetative Symptoms: Staying in bed  ADLScreening [facility] Assessment Services) Patient's cognitive ability adequate to safely complete daily activities?: Yes Patient able to express need for assistance with ADLs?: Yes Independently performs ADLs?: Yes (appropriate for developmental age)  Prior Inpatient Therapy Prior Inpatient Therapy: No  Prior Outpatient Therapy Prior Outpatient Therapy: No Does patient have an ACCT team?: No Does patient have Intensive In-House Services?  : No Does patient have [facility] services?  : No Does patient have  P4CC services?: No  ADL Screening (condition at time of admission) Patient's cognitive ability adequate to safely complete daily activities?: Yes Is the patient deaf or have difficulty hearing?: No Does the patient have difficulty seeing, even when wearing glasses/contacts?: No Does the patient have difficulty concentrating, remembering, or making decisions?: No Patient able to express need for assistance with ADLs?: Yes Does the patient have difficulty dressing or bathing?: No Independently performs ADLs?: Yes (appropriate for developmental age) Does the patient have difficulty walking or climbing stairs?: No Weakness of Legs: None Weakness of Arms/Hands: None  Home Assistive Devices/Equipment Home Assistive Devices/Equipment: None    Abuse/Neglect Assessment (Assessment to be complete while patient is alone) Abuse/Neglect Assessment Can Be Completed: Yes Physical Abuse: Denies Verbal Abuse: Denies Sexual Abuse: Denies Exploitation of patient/patient's resources: Denies Self-Neglect: Denies     Merchant navy officerAdvance Directives (For Healthcare) Does Patient Have a Medical Advance Directive?: No Would patient like information on creating a medical advance directive?: No - Patient declined          Disposition: Binnie RailJoAnn Glover[facility], confirmed adult unit is currently at capacity but there will be available beds tomorrow after discharges. Gave clinical report to Nira ConnJason Berry, NP who said Pt meets criteria for inpatient psychiatric treatment. TTS will contact other facilities for placement. Notified Dr. Cristal Deerhristopher Tegeler and [facility] staff of recommendation.  Disposition Initial Assessment Completed for this Encounter: Yes  This service was provided via telemedicine using a 2-way, interactive audio and video technology.  Names of all persons participating in this telemedicine service and their role in this encounter. Name: Leward QuanBetty B Tomko Role: Patient   Name:  Shela CommonsFord Rahmel Nedved Jr, [facility] Role: TTS counselor         Harlin RainFord Ellis Patsy BaltimoreWarrick Jr, [facility], [facility], [facility] Triage Specialist [phone]  Pamalee LeydenWarrick Jr, Sharion Grieves Ellis 09/20/2019 9:38 PM

## 2019-09-20 NOTE — ED Notes (Signed)
Pt's belongings are in [address] in [place].

## 2019-09-20 NOTE — ED Provider Notes (Signed)
[facility]-EMERGENCY DEPT Provider Note   CSN: [id number] Arrival date & time: 09/20/19  1204     History   Chief Complaint Chief Complaint  Patient presents with  . Suicidal    HPI [name] is a 58 y.o. female.     Patient with hx anxiety, depression, c/o increased feelings of stress, anxiety, and depression. States recently started lexapro for depression but doesn't think it is helping. Patient with intermittent thoughts of suicide and/or cutting self, and then also says knows she would not hurt self. States frustrated with chronic loss of energy, fatigue, intermittent reflux and gi symptoms. Pt also states sees small white thread in stool and feels is having problem with worms. Denies wt loss. Normal appetite. States after eating hamburger in June her symptoms all started and/or became worse (states the hamburger was cooked). States went to GI doctor, has had O and P checked x 1 and was negative, and that egd and colonoscopy. Also states went to a vet who told her there were worms.   The history is provided by the patient.    Past Medical History:  Diagnosis Date  . Anxiety   . Chronic GERD     There are no active problems to display for this patient.   History reviewed. No pertinent surgical history.   OB History   No obstetric history on file.      Home Medications    Prior to Admission medications   Medication Sig Start Date End Date Taking? Authorizing Provider  acetaminophen (TYLENOL) 500 MG tablet Take 1,000 mg by mouth every 6 (six) hours as needed for moderate pain or headache.    [provider]  famotidine (PEPCID) 20 MG tablet Take 20 mg by mouth 2 (two) times daily.    [provider]  Melatonin 3 MG TABS Take 1 tablet by mouth at bedtime as needed (sleep).    [provider]  omeprazole (PRILOSEC) 40 MG capsule Take 40 mg by mouth 2 (two) times daily.  08/04/19   [provider]   sucralfate (CARAFATE) 1 g tablet Take 1 g by mouth 4 (four) times daily -  with meals and at bedtime.    [provider]    Family History No family history on file.  Social History Social History   Tobacco Use  . Smoking status: Current Every Day Smoker    Packs/day: 1.00    Years: 35.00    Pack years: 35.00    Types: Cigarettes  . Smokeless tobacco: Never Used  Substance Use Topics  . Alcohol use: No  . Drug use: Never     Allergies   Patient has no known allergies.   Review of Systems Review of Systems  Constitutional: Negative for chills and fever.  HENT: Negative for sore throat.   Eyes: Negative for pain, redness and visual disturbance.  Respiratory: Negative for cough and shortness of breath.   Cardiovascular: Negative for chest pain.  Gastrointestinal: Negative for diarrhea and vomiting.  Genitourinary: Negative for flank pain.  Musculoskeletal: Negative for back pain and neck pain.  Skin: Negative for rash.  Neurological: Negative for headaches.  Hematological: Does not bruise/bleed easily.  Psychiatric/Behavioral: Positive for dysphoric mood. The patient is nervous/anxious.      Physical Exam Updated Vital Signs BP 138/89   Pulse 88   Temp 98.3 F (36.8 C) (Oral)   Resp 16   Ht 1.6 m (5\' 3" )   Wt 67.6  kg   SpO2 97%   BMI 26.39 kg/m   Physical Exam Vitals signs and nursing note reviewed.  Constitutional:      Appearance: Normal appearance. She is well-developed.  HENT:     Head: Atraumatic.     Nose: Nose normal.     Mouth/Throat:     Mouth: Mucous membranes are moist.  Eyes:     General: No scleral icterus.    Conjunctiva/sclera: Conjunctivae normal.     Pupils: Pupils are equal, round, and reactive to light.  Neck:     Musculoskeletal: Normal range of motion and neck supple. No neck rigidity or muscular tenderness.     Trachea: No tracheal deviation.  Cardiovascular:     Rate and Rhythm: Normal rate and regular rhythm.      Pulses: Normal pulses.     Heart sounds: Normal heart sounds. No murmur. No friction rub. No gallop.   Pulmonary:     Effort: Pulmonary effort is normal. No respiratory distress.     Breath sounds: Normal breath sounds.  Abdominal:     General: Bowel sounds are normal. There is no distension.     Palpations: Abdomen is soft.     Tenderness: There is no abdominal tenderness. There is no guarding.  Genitourinary:    Comments: No cva tenderness.  Musculoskeletal:        General: No swelling.  Skin:    General: Skin is warm and dry.     Findings: No rash.  Neurological:     Mental Status: She is alert.     Comments: Alert, speech normal. Steady gait.   Psychiatric:     Comments: Anxious appearing. Moves from talking about one unrelated symptom to next.       ED Treatments / Results  Labs (all labs ordered are listed, but only abnormal results are displayed) Results for orders placed or performed during the hospital encounter of 09/20/19  Comprehensive metabolic panel  Result Value Ref Range   Sodium 133 (L) 135 - 145 mmol/L   Potassium 4.1 3.5 - 5.1 mmol/L   Chloride 98 98 - 111 mmol/L   CO2 24 22 - 32 mmol/L   Glucose, Bld 117 (H) 70 - 99 mg/dL   BUN 12 6 - 20 mg/dL   Creatinine, Ser 0.52 0.44 - 1.00 mg/dL   Calcium 9.0 8.9 - 10.3 mg/dL   Total Protein 7.1 6.5 - 8.1 g/dL   Albumin 4.1 3.5 - 5.0 g/dL   AST 22 15 - 41 U/L   ALT 15 0 - 44 U/L   Alkaline Phosphatase 72 38 - 126 U/L   Total Bilirubin 0.3 0.3 - 1.2 mg/dL   GFR calc non Af Amer >60 >60 mL/min   GFR calc Af Amer >60 >60 mL/min   Anion gap 11 5 - 15  CBC  Result Value Ref Range   WBC 9.4 4.0 - 10.5 K/uL   RBC 4.38 3.87 - 5.11 MIL/uL   Hemoglobin 13.9 12.0 - 15.0 g/dL   HCT 42.8 36.0 - 46.0 %   MCV 97.7 80.0 - 100.0 fL   MCH 31.7 26.0 - 34.0 pg   MCHC 32.5 30.0 - 36.0 g/dL   RDW 13.1 11.5 - 15.5 %   Platelets 375 150 - 400 K/uL   nRBC 0.0 0.0 - 0.2 %  Ethanol  Result Value Ref Range   Alcohol, Ethyl  (B) <10 <10 mg/dL   Dg Chest 2 View  Result Date: 09/14/2019  CLINICAL DATA:  Chest pain and shortness of breath EXAM: CHEST - 2 VIEW COMPARISON:  September 04, 2019 FINDINGS: There is no edema or consolidation. Heart size and pulmonary vascularity are normal. No adenopathy. No pneumothorax. There is mild degenerative change in the thoracic spine. IMPRESSION: No edema or consolidation. Electronically Signed   By: Bretta BangWilliam  Woodruff III M.D.   On: 09/14/2019 12:58   Dg Chest 2 View  Result Date: 09/04/2019 CLINICAL DATA:  Chest pain, shortness of breath. EXAM: CHEST - 2 VIEW COMPARISON:  Radiographs and CT scan of March 09, 2019. FINDINGS: The heart size and mediastinal contours are within normal limits. Both lungs are clear. No pneumothorax or pleural effusion is noted. The visualized skeletal structures are unremarkable. IMPRESSION: No active cardiopulmonary disease. Electronically Signed   By: Lupita RaiderJames  Green Jr M.D.   On: 09/04/2019 13:41    EKG None  Radiology No results found.  Procedures Procedures (including critical care time)  Medications Ordered in ED Medications - No data to display   Initial Impression / Assessment and Plan / ED Course  I have reviewed the triage vital signs and the nursing notes.  Pertinent labs & imaging results that were available during my care of the patient were reviewed by me and considered in my medical decision making (see chart for details).  Labs sent.  Reviewed nursing notes and prior charts for additional history.   BH team consulted.   Labs reviewed by me - hgb normal, k normal. O/P pending.   BH evaluation is pending [facility].    Final Clinical Impressions(s) / ED Diagnoses   Final diagnoses:  None    ED Discharge Orders    None       Cathren LaineSteinl, Yakir Wenke, MD 09/20/19 1346

## 2019-09-21 ENCOUNTER — Encounter (HOSPITAL_COMMUNITY): Payer: Self-pay | Admitting: Registered Nurse

## 2019-09-21 MED ORDER — ONDANSETRON 4 MG PO TBDP
4.0000 mg | ORAL_TABLET | Freq: Three times a day (TID) | ORAL | Status: DC | PRN
  Administered 2019-09-21 – 2019-09-22 (×2): 4 mg via ORAL
  Filled 2019-09-21 (×2): qty 1

## 2019-09-21 MED ORDER — ONDANSETRON 8 MG PO TBDP
8.0000 mg | ORAL_TABLET | Freq: Once | ORAL | Status: AC
  Administered 2019-09-21: 8 mg via ORAL
  Filled 2019-09-21: qty 1

## 2019-09-21 MED ORDER — ESCITALOPRAM OXALATE 10 MG PO TABS
10.0000 mg | ORAL_TABLET | Freq: Every day | ORAL | Status: DC
  Administered 2019-09-21: 19:00:00 10 mg via ORAL
  Filled 2019-09-21: qty 1

## 2019-09-21 MED ORDER — ALUM & MAG HYDROXIDE-SIMETH 200-200-20 MG/5ML PO SUSP
30.0000 mL | ORAL | Status: DC | PRN
  Administered 2019-09-21 – 2019-09-22 (×3): 30 mL via ORAL
  Filled 2019-09-21 (×3): qty 30

## 2019-09-21 MED ORDER — HYDROXYZINE HCL 25 MG PO TABS
25.0000 mg | ORAL_TABLET | Freq: Three times a day (TID) | ORAL | Status: DC | PRN
  Administered 2019-09-21 (×2): 25 mg via ORAL
  Filled 2019-09-21 (×2): qty 1

## 2019-09-21 MED ORDER — MELATONIN 3 MG PO TABS
1.0000 | ORAL_TABLET | Freq: Every evening | ORAL | Status: DC | PRN
  Administered 2019-09-21: 3 mg via ORAL
  Filled 2019-09-21: qty 1

## 2019-09-21 NOTE — Progress Notes (Signed)
09/21/2019  1242  Report given to [facility]. Pt can transfer at 2pm today 09/21/2019

## 2019-09-21 NOTE — Progress Notes (Signed)
Received [name] from [place] this PM. She is very anxious and complaining of acid reflux. She was medicated per order and received a snack of her choice. She regrets her behavior from earlier today, denied feeling suicidal and wish she could go home. A Covid test was done at 2200 hrs. She remains anxious.   0243- attempted to call and give report, no answer after talking with [facility] Maudie Mercury. 0300- attempted to call report, no answer on charge nurse phone and [phone]. 0330 hrs - report given to nurse Legrand Como, [employer] called for transport and [name] was transported at 0354 hrs without incident.

## 2019-09-21 NOTE — Discharge Summary (Signed)
  Patient to be transferred to [facility] for inpatient psychiatric treatment after negative covid test.    Torryn Fiske B. Vonzell Lindblad, NP

## 2019-09-21 NOTE — Progress Notes (Addendum)
BH Progress Note  Pt was seen earlier by me and NP Shuvon Rankin via telepsych. Pt was noted to be argumentative and was fixated on getting treated for worms. She had presented with suicidal ideations with a plan to stab herself or cut her wrists. A bed was assigned to her in [facility] for in-pt psychiatric stabilization, however, pt continued to deny the need for admission.  I filled out the Affidavit for IVC and filled out the first examination. The documents were then faxed to the magistrate's office.  [place] Crane, MD 09/21/2019 3:19 PM

## 2019-09-22 ENCOUNTER — Inpatient Hospital Stay (HOSPITAL_COMMUNITY)
Admission: AD | Admit: 2019-09-22 | Discharge: 2019-09-25 | DRG: 885 | Disposition: A | Discharge status: Home / Self Care | Payer: No Typology Code available for payment source | Source: Intra-hospital | Attending: Psychiatry | Admitting: Psychiatry

## 2019-09-22 ENCOUNTER — Encounter (HOSPITAL_COMMUNITY): Payer: Self-pay

## 2019-09-22 ENCOUNTER — Other Ambulatory Visit: Payer: Self-pay

## 2019-09-22 DIAGNOSIS — F333 Major depressive disorder, recurrent, severe with psychotic symptoms: Secondary | ICD-10-CM | POA: Diagnosis not present

## 2019-09-22 DIAGNOSIS — F332 Major depressive disorder, recurrent severe without psychotic features: Secondary | ICD-10-CM | POA: Diagnosis present

## 2019-09-22 DIAGNOSIS — F419 Anxiety disorder, unspecified: Secondary | ICD-10-CM | POA: Diagnosis present

## 2019-09-22 DIAGNOSIS — K30 Functional dyspepsia: Secondary | ICD-10-CM | POA: Diagnosis present

## 2019-09-22 DIAGNOSIS — F17210 Nicotine dependence, cigarettes, uncomplicated: Secondary | ICD-10-CM | POA: Diagnosis present

## 2019-09-22 DIAGNOSIS — G4700 Insomnia, unspecified: Secondary | ICD-10-CM | POA: Diagnosis present

## 2019-09-22 DIAGNOSIS — F22 Delusional disorders: Secondary | ICD-10-CM | POA: Diagnosis present

## 2019-09-22 DIAGNOSIS — Z20828 Contact with and (suspected) exposure to other viral communicable diseases: Secondary | ICD-10-CM | POA: Diagnosis present

## 2019-09-22 DIAGNOSIS — Z79899 Other long term (current) drug therapy: Secondary | ICD-10-CM

## 2019-09-22 DIAGNOSIS — K219 Gastro-esophageal reflux disease without esophagitis: Secondary | ICD-10-CM | POA: Diagnosis present

## 2019-09-22 LAB — SARS CORONAVIRUS 2 BY RT PCR (HOSPITAL ORDER, PERFORMED IN ~~LOC~~ HOSPITAL LAB): SARS Coronavirus 2: NEGATIVE

## 2019-09-22 MED ORDER — FAMOTIDINE 20 MG PO TABS
20.0000 mg | ORAL_TABLET | Freq: Every day | ORAL | Status: DC
  Administered 2019-09-22 – 2019-09-24 (×3): 20 mg via ORAL
  Filled 2019-09-22 (×5): qty 1

## 2019-09-22 MED ORDER — TEMAZEPAM 30 MG PO CAPS
30.0000 mg | ORAL_CAPSULE | Freq: Every day | ORAL | Status: DC
  Administered 2019-09-22 – 2019-09-23 (×2): 30 mg via ORAL
  Filled 2019-09-22 (×2): qty 1

## 2019-09-22 MED ORDER — PANTOPRAZOLE SODIUM 40 MG PO TBEC
80.0000 mg | DELAYED_RELEASE_TABLET | Freq: Two times a day (BID) | ORAL | Status: DC
  Administered 2019-09-22 – 2019-09-25 (×6): 80 mg via ORAL
  Filled 2019-09-22 (×10): qty 2

## 2019-09-22 MED ORDER — PANTOPRAZOLE SODIUM 40 MG PO TBEC: 40.0000 mg | DELAYED_RELEASE_TABLET | Freq: Every day | ORAL | Status: DC

## 2019-09-22 MED ORDER — RISPERIDONE 2 MG PO TABS
2.0000 mg | ORAL_TABLET | Freq: Two times a day (BID) | ORAL | Status: DC
  Administered 2019-09-22 – 2019-09-24 (×4): 2 mg via ORAL
  Filled 2019-09-22 (×6): qty 1

## 2019-09-22 MED ORDER — ALUM & MAG HYDROXIDE-SIMETH 200-200-20 MG/5ML PO SUSP
30.0000 mL | ORAL | Status: DC | PRN
  Administered 2019-09-22 – 2019-09-24 (×3): 30 mL via ORAL
  Filled 2019-09-22 (×3): qty 30

## 2019-09-22 MED ORDER — RISPERIDONE 3 MG PO TABS: 3.0000 mg | ORAL_TABLET | Freq: Two times a day (BID) | ORAL | Status: DC

## 2019-09-22 MED ORDER — BENZTROPINE MESYLATE 1 MG PO TABS
1.0000 mg | ORAL_TABLET | Freq: Two times a day (BID) | ORAL | Status: DC
  Administered 2019-09-22 – 2019-09-25 (×7): 1 mg via ORAL
  Filled 2019-09-22 (×9): qty 1

## 2019-09-22 MED ORDER — ALUM & MAG HYDROXIDE-SIMETH 200-200-20 MG/5ML PO SUSP
30.0000 mL | Freq: Four times a day (QID) | ORAL | Status: DC | PRN
  Administered 2019-09-22: 11:00:00 30 mL via ORAL

## 2019-09-22 MED ORDER — ONDANSETRON 4 MG PO TBDP
4.0000 mg | ORAL_TABLET | Freq: Three times a day (TID) | ORAL | Status: DC | PRN
  Administered 2019-09-22 – 2019-09-23 (×2): 4 mg via ORAL
  Filled 2019-09-22 (×2): qty 1

## 2019-09-22 MED ORDER — PANTOPRAZOLE SODIUM 40 MG PO TBEC
40.0000 mg | DELAYED_RELEASE_TABLET | Freq: Every day | ORAL | Status: DC
  Administered 2019-09-22: 40 mg via ORAL
  Filled 2019-09-22 (×3): qty 1

## 2019-09-22 NOTE — H&P (Signed)
Psychiatric Admission Assessment Adult  Patient Identification: [name] MRN:  [id number] Date of Evaluation:  09/22/2019 Chief Complaint:  MDD REC SEV WITHOUT PSYCHOTIC FEATURES Principal Diagnosis: Delusional disorder somatic type/depressive disorder Diagnosis:  Active Problems:   MDD (major depressive disorder), recurrent severe, without psychosis (HCC)  History of Present Illness:   Patient reports no prior psychiatric admissions but has had numerous health care encounters in this system.  She is 58 years of age, divorced and came to our attention through the emergency department on 10/4.  EMS had been called the patient was pacing around her home with a knife and intending on either stabbing herself or cutting her wrist.  The patient is driven by the delusional believes that she has been infested with parasites and "worms" and believes she has had visual evidence of these worms in her stool.  She is suffered from insomnia, obsessing about this somatic delusional issue, and even went so far as to ingest an antiparasitic, believe the ivermectin, from a veterinary preparation that she states she got from a friend who works with a Psychiatrist.  The patient is insistent that the infestation is real she does not except that it is possibly delusional but she does have numerous somatic complaints as well and her chart does list numerous encounters for somatization.  More recently she is focused on GI upset and states she ate "a bad hamburger" in June and believes this is the source of the infection.  On current mental status exam she is generally pleasant she is little intrusive and hypomanic she is talkative she denies current thoughts of harming self or others again insist that she simply needs the infections treated and she will be okay and tends to direct care requesting numerous GI medications.  But she does not have current auditory or visual hallucinations.  Associated  Signs/Symptoms: Depression Symptoms:  insomnia, (Hypo) Manic Symptoms:  Distractibility, Anxiety Symptoms:  Excessive Worry, Psychotic Symptoms:  Delusions, PTSD Symptoms: NA Total Time spent with patient: 45 minutes  Past Psychiatric History: denies  Is the patient at risk to self? Yes.    Has the patient been a risk to self in the past 6 months? No.  Has the patient been a risk to self within the distant past? No.  Is the patient a risk to others? No.  Has the patient been a risk to others in the past 6 months? No.  Has the patient been a risk to others within the distant past? No.   Prior Inpatient Therapy:   Prior Outpatient Therapy:    Alcohol Screening: 1. How often do you have a drink containing alcohol?: Never 2. How many drinks containing alcohol do you have on a typical day when you are drinking?: 1 or 2 3. How often do you have six or more drinks on one occasion?: Never AUDIT-C Score: 0 4. How often during the last year have you found that you were not able to stop drinking once you had started?: Never 5. How often during the last year have you failed to do what was normally expected from you becasue of drinking?: Never 6. How often during the last year have you needed a first drink in the morning to get yourself going after a heavy drinking session?: Never 7. How often during the last year have you had a feeling of guilt of remorse after drinking?: Never 8. How often during the last year have you been unable to remember what happened  the night before because you had been drinking?: Never 9. Have you or someone else been injured as a result of your drinking?: No 10. Has a relative or friend or a doctor or another health worker been concerned about your drinking or suggested you cut down?: No Alcohol Use Disorder Identification Test Final Score (AUDIT): 0 Substance Abuse History in the last 12 months:  No. Consequences of Substance Abuse: NA Previous Psychotropic  Medications: No  Psychological Evaluations: No  Past Medical History:  Past Medical History:  Diagnosis Date  . Anxiety   . Chronic GERD    History reviewed. No pertinent surgical history. Family History: History reviewed. No pertinent family history. Family Psychiatric  History: denied Tobacco Screening:   Social History:  Social History   Substance and Sexual Activity  Alcohol Use No     Social History   Substance and Sexual Activity  Drug Use Never    Additional Social History:                           Allergies:  No Known Allergies Lab Results:  Results for orders placed or performed during the hospital encounter of 09/20/19 (from the past 48 hour(s))  Comprehensive metabolic panel     Status: Abnormal   Collection Time: 09/20/19  1:00 PM  Result Value Ref Range   Sodium 133 (L) 135 - 145 mmol/L   Potassium 4.1 3.5 - 5.1 mmol/L   Chloride 98 98 - 111 mmol/L   CO2 24 22 - 32 mmol/L   Glucose, Bld 117 (H) 70 - 99 mg/dL   BUN 12 6 - 20 mg/dL   Creatinine, Ser 4.09 0.44 - 1.00 mg/dL   Calcium 9.0 8.9 - 81.1 mg/dL   Total Protein 7.1 6.5 - 8.1 g/dL   Albumin 4.1 3.5 - 5.0 g/dL   AST 22 15 - 41 U/L   ALT 15 0 - 44 U/L   Alkaline Phosphatase 72 38 - 126 U/L   Total Bilirubin 0.3 0.3 - 1.2 mg/dL   GFR calc non Af Amer >60 >60 mL/min   GFR calc Af Amer >60 >60 mL/min   Anion gap 11 5 - 15    Comment: Performed at [facility], [address] [place], [place] [address]  CBC     Status: None   Collection Time: 09/20/19  1:00 PM  Result Value Ref Range   WBC 9.4 4.0 - 10.5 K/uL   RBC 4.38 3.87 - 5.11 MIL/uL   Hemoglobin 13.9 12.0 - 15.0 g/dL   HCT 29.5 62.1 - 30.8 %   MCV 97.7 80.0 - 100.0 fL   MCH 31.7 26.0 - 34.0 pg   MCHC 32.5 30.0 - 36.0 g/dL   RDW 65.7 84.6 - 96.2 %   Platelets 375 150 - 400 K/uL   nRBC 0.0 0.0 - 0.2 %    Comment: Performed at [facility], [address] [place], [place] [address]   Ethanol     Status: None   Collection Time: 09/20/19  1:01 PM  Result Value Ref Range   Alcohol, Ethyl (B) <10 <10 mg/dL    Comment: (NOTE) Lowest detectable limit for serum alcohol is 10 mg/dL. For medical purposes only. Performed at [facility], [address] [place], [place] [address]   Rapid urine drug screen (hospital performed)     Status: None   Collection Time: 09/20/19  1:08  PM  Result Value Ref Range   Opiates NONE DETECTED NONE DETECTED   Cocaine NONE DETECTED NONE DETECTED   Benzodiazepines NONE DETECTED NONE DETECTED   Amphetamines NONE DETECTED NONE DETECTED   Tetrahydrocannabinol NONE DETECTED NONE DETECTED   Barbiturates NONE DETECTED NONE DETECTED    Comment: (NOTE) DRUG SCREEN FOR MEDICAL PURPOSES ONLY.  IF CONFIRMATION IS NEEDED FOR ANY PURPOSE, NOTIFY LAB WITHIN 5 DAYS. LOWEST DETECTABLE LIMITS FOR URINE DRUG SCREEN Drug Class                     Cutoff (ng/mL) Amphetamine and metabolites    1000 Barbiturate and metabolites    200 Benzodiazepine                 200 Tricyclics and metabolites     300 Opiates and metabolites        300 Cocaine and metabolites        300 THC                            50 Performed at [facility], [address] [place], [place] [address]   SARS Coronavirus 2 [facility] order, Performed in [facility] hospital lab) Nasopharyngeal Nasopharyngeal Swab     Status: None   Collection Time: 09/21/19 10:40 PM   Specimen: Nasopharyngeal Swab  Result Value Ref Range   SARS Coronavirus 2 NEGATIVE NEGATIVE    Comment: (NOTE) If result is NEGATIVE SARS-CoV-2 target nucleic acids are NOT DETECTED. The SARS-CoV-2 RNA is generally detectable in upper and lower  respiratory specimens during the acute phase of infection. The lowest  concentration of SARS-CoV-2 viral copies this assay can detect is 250  copies / mL. A negative result does not preclude SARS-CoV-2 infection  and should not be  used as the sole basis for treatment or other  patient management decisions.  A negative result may occur with  improper specimen collection / handling, submission of specimen other  than nasopharyngeal swab, presence of viral mutation(s) within the  areas targeted by this assay, and inadequate number of viral copies  (<250 copies / mL). A negative result must be combined with clinical  observations, patient history, and epidemiological information. If result is POSITIVE SARS-CoV-2 target nucleic acids are DETECTED. The SARS-CoV-2 RNA is generally detectable in upper and lower  respiratory specimens dur ing the acute phase of infection.  Positive  results are indicative of active infection with SARS-CoV-2.  Clinical  correlation with patient history and other diagnostic information is  necessary to determine patient infection status.  Positive results do  not rule out bacterial infection or co-infection with other viruses. If result is PRESUMPTIVE POSTIVE SARS-CoV-2 nucleic acids MAY BE PRESENT.   A presumptive positive result was obtained on the submitted specimen  and confirmed on repeat testing.  While 2019 novel coronavirus  (SARS-CoV-2) nucleic acids may be present in the submitted sample  additional confirmatory testing may be necessary for epidemiological  and / or clinical management purposes  to differentiate between  SARS-CoV-2 and other Sarbecovirus currently known to infect humans.  If clinically indicated additional testing with an alternate test  methodology [phone]) is advised. The SARS-CoV-2 RNA is generally  detectable in upper and lower respiratory sp ecimens during the acute  phase of infection. The expected result is Negative. Fact Sheet for Patients:  BoilerBrush.com.cy Fact Sheet for Healthcare Providers: https://pope.com/ This test is not yet  approved or cleared by the [place] FDA and has been authorized  for detection and/or diagnosis of SARS-CoV-2 by FDA under an Emergency Use Authorization (EUA).  This EUA will remain in effect (meaning this test can be used) for the duration of the COVID-19 declaration under Section 564(b)(1) of the Act, 21 U.S.C. section 360bbb-3(b)(1), unless the authorization is terminated or revoked sooner. Performed at [facility], [address] [place], [place] [address]     Blood Alcohol level:  Lab Results  Component Value Date   ETH <10 09/20/2019    Metabolic Disorder Labs:  No results found for: HGBA1C, MPG No results found for: PROLACTIN Lab Results  Component Value Date   CHOL  07/30/2007    189        ATP III CLASSIFICATION:  <200     mg/dL   Desirable  [id number]  mg/dL   Borderline High  >=147>=240    mg/dL   High   TRIG [id number] 56/21/308608/13/2008   HDL 26 (L) 07/30/2007   CHOLHDL 7.3 07/30/2007   VLDL 23 07/30/2007   LDLCALC (H) 07/30/2007    140        Total Cholesterol/HDL:CHD Risk Coronary Heart Disease Risk Table                     Men   Women  1/2 Average Risk   3.4   3.3    Current Medications: Current Facility-Administered Medications  Medication Dose Route Frequency Provider Last Rate Last Dose  . alum & mag hydroxide-simeth (MAALOX/MYLANTA) 200-200-20 MG/5ML suspension 30 mL  30 mL Oral Q6H PRN Aldean BakerSykes, Janet E, NP      . benztropine (COGENTIN) tablet 1 mg  1 mg Oral BID Malvin JohnsFarah, Soul Deveney, MD      . pantoprazole (PROTONIX) EC tablet 80 mg  80 mg Oral BID Malvin JohnsFarah, Orby Tangen, MD      . risperiDONE (RISPERDAL) tablet 3 mg  3 mg Oral BID Malvin JohnsFarah, Ashlie Mcmenamy, MD      . temazepam (RESTORIL) capsule 30 mg  30 mg Oral QHS Malvin JohnsFarah, Naviyah Schaffert, MD       PTA Medications: Medications Prior to Admission  Medication Sig Dispense Refill Last Dose  . acetaminophen (TYLENOL) 500 MG tablet Take 1,000 mg by mouth every 6 (six) hours as needed for moderate pain or headache.     . escitalopram (LEXAPRO) 10 MG tablet Take 10 mg by mouth daily.     . famotidine  (PEPCID) 20 MG tablet Take 20 mg by mouth at bedtime.      . Melatonin 3 MG TABS Take 1 tablet by mouth at bedtime as needed (sleep).     . methocarbamol (ROBAXIN) 500 MG tablet Take 500 mg by mouth 2 (two) times daily.     Marland Kitchen. omeprazole (PRILOSEC) 40 MG capsule Take 40 mg by mouth 2 (two) times daily.      . ondansetron (ZOFRAN-ODT) 4 MG disintegrating tablet Take 4 mg by mouth every 8 (eight) hours as needed for nausea.       Musculoskeletal: Strength & Muscle Tone: within normal limits Gait & Station: normal Patient leans: N/A  Psychiatric Specialty Exam: Physical Exam  Nursing note and vitals reviewed. Constitutional: She appears well-developed and well-nourished.  Cardiovascular: Normal rate and regular rhythm.    Review of Systems  Constitutional: Negative.   Eyes: Negative.   Cardiovascular: Negative.   Gastrointestinal: Positive for abdominal pain, diarrhea and nausea.  Skin: Negative.  Neurological: Negative.   Endo/Heme/Allergies: Negative.     Blood pressure 108/73, pulse 88, temperature 98.6 F (37 C), temperature source Oral, resp. rate 18, height 5\' 3"  (1.6 m), weight 64.9 kg.Body mass index is 25.33 kg/m.  General Appearance: Bizarre  Eye Contact:  Good  Speech:  Clear and Coherent and Pressured  Volume:  Normal  Mood:  Hypomanic  Affect:  Congruent  Thought Process:  Linear and Descriptions of Associations: Circumstantial  Orientation:  Full (Time, Place, and Person)  Thought Content:  Delusions, Obsessions and Rumination  Suicidal Thoughts:  No  Homicidal Thoughts:  No  Memory:  Immediate;   Fair Recent;   Fair Remote;   Fair  Judgement:  Fair  Insight: Lacking due to delusional believes  Psychomotor Activity:  Normal  Concentration:  Concentration: Fair and Attention Span: Fair  Recall:  [employer] of Knowledge:  Good  Language:  Good  Akathisia:  Negative  Handed:  Right  AIMS (if indicated):     Assets:  Physical Health Resilience Social  Support  ADL's:  Intact  Cognition:  WNL  Sleep:       Treatment Plan Summary: Daily contact with patient to assess and evaluate symptoms and progress in treatment and Medication management  Observation Level/Precautions:  15 minute checks  Laboratory:  UDS  Psychotherapy: Reality based  Medications: Antipsychotic  Consultations: None necessary  Discharge Concerns: Longer term stability  Estimated LOS: 5-7  Other: 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/begin perphenazine   Physician Treatment Plan for Primary Diagnosis: Begin reality based therapy/cognitive therapy for delusions Long Term Goal(s): Improvement in symptoms so as ready for discharge  Short Term Goals: Ability to identify changes in lifestyle to reduce recurrence of condition will improve, Ability to verbalize feelings will improve, Ability to identify and develop effective coping behaviors will improve and Ability to maintain clinical measurements within normal limits will improve  Physician Treatment Plan for Secondary Diagnosis: Active Problems:   MDD (major depressive disorder), recurrent severe, without psychosis ([place])  Long Term Goal(s): Improvement in symptoms so as ready for discharge  Short Term Goals: Ability to identify and develop effective coping behaviors will improve, Ability to maintain clinical measurements within normal limits will improve, Compliance with prescribed medications will improve and Ability to identify triggers associated with substance abuse/mental health issues will improve  I certify that inpatient services furnished can reasonably be expected to improve the patient's condition.    Johnn Hai, MD 10/6/202010:21 AM

## 2019-09-22 NOTE — BHH Counselor (Signed)
Adult Comprehensive Assessment  Patient ID: [name], female   DOB: 24-Apr-1961, 58 y.o.   MRN: [id number]  Information Source: Information source: Patient  Current Stressors:  Patient states their primary concerns and needs for treatment are:: "I was stupid. I was wore out and I called 911 to come get me. 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 Family Relationships: Pt denies stressors. Financial / Lack of resources (include bankruptcy): Pt denies stressors. Housing / Lack of housing: Pt denies stressors. Physical health (include injuries & life threatening diseases): Pt reports having acid reflux and GERD. Social relationships: Pt denies stressors. Substance abuse: Pt denies stressors. Bereavement / Loss: Pt denies stressors.  Living/Environment/Situation:  Living Arrangements: Alone Living conditions (as described by patient or guardian): "It is fine" Who else lives in the home?: Alone How long has patient lived in current situation?: 2 years What is atmosphere in current home: Supportive, Comfortable, Loving  Family History:  Marital status: Divorced Divorced, when?: 2 years What types of issues is patient dealing with in the relationship?: "We just fell apart" Additional relationship information: Pt reports that her and her ex husband are still good. Are you sexually active?: No What is your sexual orientation?: Heterosexual Has your sexual activity been affected by drugs, alcohol, medication, or emotional stress?: No Does patient have children?: Yes How many children?: 1(son) How is patient's relationship  with their children?: "He is perfect. He is an angel"  Childhood History:  By whom was/is the patient raised?: Both parents Description of patient's relationship with caregiver when they were a child: "[place]. My mother was my best friend" Patient's description of current relationship with people who raised him/her: Pt reports both of her parents are deceased. How were you disciplined when you got in trouble as a child/adolescent?: Spankings (appropriate) Does patient have siblings?: Yes Number of Siblings: 2(sisters) Description of patient's current relationship with siblings: "It's good and my older sister and I are real close" Did patient suffer any verbal/emotional/physical/sexual abuse as a child?: No Did patient suffer from severe childhood neglect?: No Has patient ever been sexually abused/assaulted/raped as an adolescent or adult?: No Was the patient ever a victim of a crime or a disaster?: No Witnessed domestic violence?: No Has patient been effected by domestic violence as an adult?: No  Education:  Highest grade of school patient has completed: Designer, industrial/product Currently a Ship broker?: No Learning disability?: No  Employment/Work Situation:   Employment situation: Employed Where is patient currently employed?: [employer] How long has patient been employed?: 5 months Patient's job has been impacted by current illness: No What is the longest time patient has a held a job?: [employer] job Where was the patient employed at that time?: 15 years Did You Receive Any Psychiatric Treatment/Services While in Passenger transport manager?: No Are There Guns or Other Weapons in [place]?: No  Financial Resources:   Financial resources: Income from employment, Private insurance Does patient have a representative payee or guardian?: No  Alcohol/Substance Abuse:   What has been your use of drugs/alcohol within the last 12 months?: Pt denies substance use. If attempted suicide, did  drugs/alcohol play a  role in this?: No Alcohol/Substance Abuse Treatment Hx: Denies past history Has alcohol/substance abuse ever caused legal problems?: No  Social Support System:   Patient's Community Support System: Good Describe Community Support System: Son Type of faith/religion: "Born again Curator" How does patient's faith help to cope with current illness?: "God helps me get through it"  Leisure/Recreation:   Leisure and Hobbies: Pt reports that she has not done much of anything lately. She does like to watch [employer] movies.  Strengths/Needs:   What is the patient's perception of their strengths?: Compassionate with people Patient states they can use these personal strengths during their treatment to contribute to their recovery: "If I am able to help someone it helps me feel better about me" Patient states these barriers may affect/interfere with their treatment: N/A Patient states these barriers may affect their return to the community: N/A Other important information patient would like considered in planning for their treatment: N/A  Discharge Plan:   Currently receiving community mental health services: No Patient states concerns and preferences for aftercare planning are: Patient reports that she will find her own PCP doctor. Patient states they will know when they are safe and ready for discharge when: "I hope by Thursday" Does patient have access to transportation?: Yes(ex-husband) Does patient have financial barriers related to discharge medications?: No Will patient be returning to same living situation after discharge?: Yes(home)  Summary/Recommendations:   Summary and Recommendations (to be completed by the evaluator): Patient is a 58 year old female who presents unaccompanied to [facility] ED via EMS reporting symptoms of depression, anxiety and suicidal ideation. Pt's diagnosis is: MDD (major depressive disorder), recurrent severe, without psychosis (HCC). Recommendations for pt  include: crisis stabilization, therapeutic milieu, medication management, attend and participate in group therapy, and development of a comprehensive mental wellness plan.  Delphia Grates. 09/22/2019

## 2019-09-22 NOTE — Progress Notes (Signed)
D: Pt denies SI/HI/AVH. Pt is pleasant and cooperative. Pt continues to have issues with her stomach and anxiety issues.  A: Pt was offered support and encouragement. Pt was given scheduled medications. Pt was encourage to attend groups. Q 15 minute checks were done for safety.  R:Pt attends groups and interacts well with peers and staff. Pt is taking medication. Pt has no complaints.Pt receptive to treatment and safety maintained on unit.

## 2019-09-22 NOTE — Tx Team (Signed)
Initial Treatment Plan 09/22/2019 5:52 AM [name] YTW:[id number]    PATIENT STRESSORS: Financial difficulties Health problems Medication change or noncompliance   PATIENT STRENGTHS: General fund of knowledge Motivation for treatment/growth   PATIENT IDENTIFIED PROBLEMS:  risk for suicide  anxiety  " stop worrying about finances, job and sickness"                 DISCHARGE CRITERIA:  Improved stabilization in mood, thinking, and/or behavior Verbal commitment to aftercare and medication compliance  PRELIMINARY DISCHARGE PLAN: Attend aftercare/continuing care group Outpatient therapy  PATIENT/FAMILY INVOLVEMENT: This treatment plan has been presented to and reviewed with the patient, [name].  The patient and family have been given the opportunity to ask questions and make suggestions.  Providence Crosby, RN 09/22/2019, 5:52 AM

## 2019-09-22 NOTE — Plan of Care (Signed)
Nurse discussed anxiety, depression and coping skills with patient.  

## 2019-09-22 NOTE — Progress Notes (Signed)
Recreation Therapy Notes  INPATIENT RECREATION THERAPY ASSESSMENT  Patient Details Name: [name] MRN: [id number] DOB: Jun 29, 1961 Today's Date: 09/22/2019       Information Obtained From: Patient  Able to Participate in Assessment/Interview: Yes  Patient Presentation: Alert  Reason for Admission (Per Patient): Other (Comments)(Pt stated she called 911 because she couldn't breathe and they thought she was going to kill herself".)  Patient Stressors: (None identified)  Coping Skills:   TV, Sports, Meditate, Deep Breathing, Talk, Prayer, Read, Hot Bath/Shower  Leisure Interests (2+):  Community - Shopping mall, Medical laboratory scientific officer - Other (Comment), Social - Family, Individual - Other (Comment)(Visit sister; Home body; Go out to eat)  Frequency of Recreation/Participation: Other (Comment)(Out to eat - Weekly; Visit sister - Monthly)  Awareness of Community Resources:  Yes  Community Resources:  Mall  Current Use: No  If no, Barriers?: Other (Comment)(COVID)  Expressed Interest in [employer] Information: No  County of Residence:  [place]  Patient Main Form of Transportation: Car  Patient Strengths:  Compassionate; Get along with others  Patient Identified Areas of Improvement:  Go community groups more  Patient Goal for Hospitalization:  "to go home, calm down a little bit"  Current SI (including self-harm):  No  Current HI:  No  Current AVH: No  Staff Intervention Plan: Group Attendance, Collaborate with Interdisciplinary Treatment Team  Consent to Intern Participation: N/A    Victorino Sparrow, LRT/CTRS  Ria Comment, Emilynn Srinivasan A 09/22/2019, 1:00 PM

## 2019-09-22 NOTE — BHH Suicide Risk Assessment (Signed)
[facility] Admission Suicide Risk Assessment   Nursing information obtained from:  Patient Demographic factors:  Unemployed, Living alone Current Mental Status:  Suicide plan, Suicidal ideation indicated by patient Loss Factors:  NA Historical Factors:  NA Risk Reduction Factors:  Positive social support  Total Time spent with patient: 45 minutes Principal Problem: Delusional believes propelling self-harm thoughts and behavior Diagnosis:  Active Problems:   MDD (major depressive disorder), recurrent severe, without psychosis ([employer])  Subjective Data: Delusions of parasitosis  Continued Clinical Symptoms:  Alcohol Use Disorder Identification Test Final Score (AUDIT): 0 The "Alcohol Use Disorders Identification Test", Guidelines for Use in Primary Care, Second Edition.  [facility]). Score between 0-7:  no or low risk or alcohol related problems. Score between 8-15:  moderate risk of alcohol related problems. Score between 16-19:  high risk of alcohol related problems. Score 20 or above:  warrants further diagnostic evaluation for alcohol dependence and treatment.   CLINICAL FACTORS:   Currently Psychotic  Musculoskeletal: Strength & Muscle Tone: within normal limits Gait & Station: normal Patient leans: N/A  Psychiatric Specialty Exam: Physical Exam  Nursing note and vitals reviewed. Constitutional: She appears well-developed and well-nourished.  Cardiovascular: Normal rate and regular rhythm.    Review of Systems  Constitutional: Negative.   Eyes: Negative.   Cardiovascular: Negative.   Gastrointestinal: Positive for abdominal pain, diarrhea and nausea.  Skin: Negative.   Neurological: Negative.   Endo/Heme/Allergies: Negative.     Blood pressure 108/73, pulse 88, temperature 98.6 F (37 C), temperature source Oral, resp. rate 18, height 5\' 3"  (1.6 m), weight 64.9 kg.Body mass index is 25.33 kg/m.  General Appearance: Bizarre  Eye Contact:  Good   Speech:  Clear and Coherent and Pressured  Volume:  Normal  Mood:  Hypomanic  Affect:  Congruent  Thought Process:  Linear and Descriptions of Associations: Circumstantial  Orientation:  Full (Time, Place, and Person)  Thought Content:  Delusions, Obsessions and Rumination  Suicidal Thoughts:  No  Homicidal Thoughts:  No  Memory:  Immediate;   Fair Recent;   Fair Remote;   Fair  Judgement:  Fair  Insight: Lacking due to delusional believes  Psychomotor Activity:  Normal  Concentration:  Concentration: Fair and Attention Span: Fair  Recall:  [employer] of Knowledge:  Good  Language:  Good  Akathisia:  Negative  Handed:  Right  AIMS (if indicated):     Assets:  Physical Health Resilience Social Support  ADL's:  Intact  Cognition:  WNL  Sleep:        COGNITIVE FEATURES THAT CONTRIBUTE TO RISK:  Polarized thinking    SUICIDE RISK:   Mild:  Suicidal ideation of limited frequency, intensity, duration, and specificity.  There are no identifiable plans, no associated intent, mild dysphoria and related symptoms, good self-control (both objective and subjective assessment), few other risk factors, and identifiable protective factors, including available and accessible social support.  PLAN OF CARE: see eval  I certify that inpatient services furnished can reasonably be expected to improve the patient's condition.   Johnn Hai, MD 09/22/2019, 10:28 AM

## 2019-09-22 NOTE — Progress Notes (Signed)
Admission Note:  58 yr female who presents IVC in no acute distress for the treatment of SI and Depression. Pt appears flat and depressed. Pt was calm and cooperative with admission process. Pt denies SI/ HI/ AVH at this time. Pt stated she had a bad hamburger June 16th and started having GI problems and went to a Copywriter, advertising and he said there was nothing with her. Pt then went to a veterinarian around August and they took a stool sample and they looked at it under a microscope and she saw worms. Pt went back to her GI doctor and they took a stool sample and it was negative. Pt stated before coming in she called the ambulance because she could not breath due to her acid reflux. Pt stated she has been very anxious because people believe " I'm crazy".   A:Skin was assessed and found to be clear of any abnormal marks apart from a bruise on R-antecubital . PT searched and no contraband found, POC and unit policies explained and understanding verbalized. Consents obtained. Food and fluids offered, and accepted.   R:Pt had no additional questions or concerns.

## 2019-09-22 NOTE — Progress Notes (Signed)
Adult Psychoeducational Group Note  Date:  09/22/2019 Time:  10:34 PM  Group Topic/Focus:  Wrap-Up Group:   The focus of this group is to help patients review their daily goal of treatment and discuss progress on daily workbooks.  Participation Level:  Active  Participation Quality:  Appropriate  Affect:  Appropriate  Cognitive:  Appropriate  Insight: Appropriate  Engagement in Group:  Developing/Improving  Modes of Intervention:  Discussion  Additional Comments:  Pt stated her goal for today was to focus on her treatment plan. Pt stated she felt she accomplished her goal today. Pt stated her relationship with her family has improved since she was admitted here. Pt stated she felt  about the same about herself today. Pt rated her overall day an 8 out of 10. Pt stated her appetite was improving today. Pt stated her goal for tonight is to get some rest. Pt did complain of pain in her stomach. Pt rated her pain level a 3. Pt nurse was made aware of the situation. Pt stated she was not hearing or seeing anything that was not there. Pt stated she had no thoughts of harming herself or others. Pt stated she would alert staff if anything changes.  Candy Sledge 09/22/2019, 10:34 PM

## 2019-09-22 NOTE — Progress Notes (Signed)
D:  Patient's self inventory sheet, patient sleeps good, no sleep medication.  Good appetite, low energy level, good concentration.  Depression 7, denied hopeless and anxiety.  Denied withdrawals.  Denied SI.  Denied physical problems.  Denied physical pain.  Goal is get better.  Plans to be herself.  No discharge plans. A:  Medications administered per MD orders.  Emotional support and encouragement given patient. R:  Safety maintained with 15 minute checks.  Patient denied Si and HI, contracts for safety.  Denied A/V hallucinations.

## 2019-09-23 DIAGNOSIS — F332 Major depressive disorder, recurrent severe without psychotic features: Secondary | ICD-10-CM

## 2019-09-23 MED ORDER — ONDANSETRON 4 MG PO TBDP: 8.0000 mg | ORAL_TABLET | Freq: Three times a day (TID) | ORAL | Status: DC | PRN

## 2019-09-23 MED ORDER — ONDANSETRON 4 MG PO TBDP
4.0000 mg | ORAL_TABLET | Freq: Three times a day (TID) | ORAL | Status: DC | PRN
  Administered 2019-09-23 – 2019-09-24 (×3): 4 mg via ORAL
  Filled 2019-09-23 (×3): qty 1

## 2019-09-23 MED ORDER — SUCRALFATE 1 GM/10ML PO SUSP
1.0000 g | Freq: Three times a day (TID) | ORAL | Status: DC
  Administered 2019-09-24 – 2019-09-25 (×2): 1 g via ORAL
  Filled 2019-09-23 (×12): qty 10

## 2019-09-23 NOTE — Progress Notes (Signed)
Recreation Therapy Notes  Date: 10.7.20 Time: 0930 Location: [address] Dayroom  Group Topic: Communication  Goal Area(s) Addresses:  Patient will effectively communicate with peers in group.  Patient will verbalize benefit of healthy communication. Patient will verbalize positive effect of healthy communication on post d/c goals.  Patient will identify communication techniques that made activity effective for group.   Behavioral Response:  Engaged  Intervention: Drawings, pencils, blank paper   Activity: Geometrical Drawings.  One patient was given a drawing to describe to the group.  The remainder of the group was to draw the picture as it was described to them by the presenter.  The presenter can get as detailed as needed.  The group can not ask any detailed questions.  They can only ask the speaker to repeat themselves.  Education: Communication, Discharge Planning  Education Outcome: Acknowledges understanding/In group clarification offered/Needs additional education.   Clinical Observations/Feedback:  Pt was active and attentive during group session.  Pt expressed body language and texting are other ways to communicate.  Pt explained she could improve communication with others outside of the hospital by watching her tone.  Pt stated "I'm straightforward and can be blount".    Victorino Sparrow, LRT/CTRS     Victorino Sparrow A 09/23/2019 10:37 AM

## 2019-09-23 NOTE — BHH Suicide Risk Assessment (Signed)
[place] INPATIENT:  Family/Significant Other Suicide Prevention Education  Suicide Prevention Education:  Education Completed;  son, Eliani Leclere [phone]) has been identified by the patient as the family member/significant other with whom the patient will be residing, and identified as the person(s) who will aid the patient in the event of a mental health crisis (suicidal ideations/suicide attempt).  With written consent from the patient, the family member/significant other has been provided the following suicide prevention education, prior to the and/or following the discharge of the patient.  The suicide prevention education provided includes the following:  Suicide risk factors  Suicide prevention and interventions  National Suicide Hotline telephone number  [facility] assessment telephone number  [facility] Emergency Assistance [place] and/or Residential Mobile Crisis Unit telephone number  Request made of family/significant other to:  Remove weapons (e.g., guns, rifles, knives), all items previously/currently identified as safety concern.    Remove drugs/medications (over-the-counter, prescriptions, illicit drugs), all items previously/currently identified as a safety concern.  The family member/significant other verbalizes understanding of the suicide prevention education information provided.  The family member/significant other agrees to remove the items of safety concern listed above.  Patient son's reports he has no safety concerns and that he feels his mother's issues are more medical than psychiatric. He is agreeable to her discharging tomorrow, he has no concerns or questions.  Joellen Jersey 09/23/2019, 3:06 PM

## 2019-09-23 NOTE — Tx Team (Signed)
Interdisciplinary Treatment and Diagnostic Plan Update  09/23/2019 Time of Session: 9:00am [name] MRN: [id number]  Principal Diagnosis: <principal problem not specified>  Secondary Diagnoses: Active Problems:   MDD (major depressive disorder), recurrent severe, without psychosis ([place])   Current Medications:  Current Facility-Administered Medications  Medication Dose Route Frequency Provider Last Rate Last Dose  . alum & mag hydroxide-simeth (MAALOX/MYLANTA) 200-200-20 MG/5ML suspension 30 mL  30 mL Oral Q4H PRN Johnn Hai, MD   30 mL at 09/22/19 1625  . benztropine (COGENTIN) tablet 1 mg  1 mg Oral BID Johnn Hai, MD   1 mg at 09/23/19 0751  . famotidine (PEPCID) tablet 20 mg  20 mg Oral QHS Johnn Hai, MD   20 mg at 09/22/19 2133  . ondansetron (ZOFRAN-ODT) disintegrating tablet 4 mg  4 mg Oral Q8H PRN Johnn Hai, MD      . pantoprazole (PROTONIX) EC tablet 80 mg  80 mg Oral BID Johnn Hai, MD   80 mg at 09/23/19 0630  . risperiDONE (RISPERDAL) tablet 2 mg  2 mg Oral BID Johnn Hai, MD   2 mg at 09/23/19 0751  . temazepam (RESTORIL) capsule 30 mg  30 mg Oral QHS Johnn Hai, MD   30 mg at 09/22/19 2133   PTA Medications: Medications Prior to Admission  Medication Sig Dispense Refill Last Dose  . acetaminophen (TYLENOL) 500 MG tablet Take 1,000 mg by mouth every 6 (six) hours as needed for moderate pain or headache.     . escitalopram (LEXAPRO) 10 MG tablet Take 10 mg by mouth daily.     . famotidine (PEPCID) 20 MG tablet Take 20 mg by mouth at bedtime.      . Melatonin 3 MG TABS Take 1 tablet by mouth at bedtime as needed (sleep).     . methocarbamol (ROBAXIN) 500 MG tablet Take 500 mg by mouth 2 (two) times daily.     Marland Kitchen omeprazole (PRILOSEC) 40 MG capsule Take 40 mg by mouth 2 (two) times daily.      . ondansetron (ZOFRAN-ODT) 4 MG disintegrating tablet Take 4 mg by mouth every 8 (eight) hours as needed for nausea.       Patient Stressors: Financial  difficulties Health problems Medication change or noncompliance  Patient Strengths: Technical sales engineer for treatment/growth  Treatment Modalities: Medication Management, Group therapy, Case management,  1 to 1 session with clinician, Psychoeducation, Recreational therapy.   Physician Treatment Plan for Primary Diagnosis: <principal problem not specified> Long Term Goal(s): Improvement in symptoms so as ready for discharge Improvement in symptoms so as ready for discharge   Short Term Goals: Ability to identify changes in lifestyle to reduce recurrence of condition will improve Ability to verbalize feelings will improve Ability to identify and develop effective coping behaviors will improve Ability to maintain clinical measurements within normal limits will improve Ability to identify and develop effective coping behaviors will improve Ability to maintain clinical measurements within normal limits will improve Compliance with prescribed medications will improve Ability to identify triggers associated with substance abuse/mental health issues will improve  Medication Management: Evaluate patient's response, side effects, and tolerance of medication regimen.  Therapeutic Interventions: 1 to 1 sessions, Unit Group sessions and Medication administration.  Evaluation of Outcomes: Not Met  Physician Treatment Plan for Secondary Diagnosis: Active Problems:   MDD (major depressive disorder), recurrent severe, without psychosis ([place])  Long Term Goal(s): Improvement in symptoms so as ready for discharge Improvement in symptoms so as  ready for discharge   Short Term Goals: Ability to identify changes in lifestyle to reduce recurrence of condition will improve Ability to verbalize feelings will improve Ability to identify and develop effective coping behaviors will improve Ability to maintain clinical measurements within normal limits will improve Ability to identify and  develop effective coping behaviors will improve Ability to maintain clinical measurements within normal limits will improve Compliance with prescribed medications will improve Ability to identify triggers associated with substance abuse/mental health issues will improve     Medication Management: Evaluate patient's response, side effects, and tolerance of medication regimen.  Therapeutic Interventions: 1 to 1 sessions, Unit Group sessions and Medication administration.  Evaluation of Outcomes: Not Met   RN Treatment Plan for Primary Diagnosis: <principal problem not specified> Long Term Goal(s): Knowledge of disease and therapeutic regimen to maintain health will improve  Short Term Goals: Ability to verbalize feelings will improve, Ability to identify and develop effective coping behaviors will improve and Compliance with prescribed medications will improve  Medication Management: RN will administer medications as ordered by provider, will assess and evaluate patient's response and provide education to patient for prescribed medication. RN will report any adverse and/or side effects to prescribing provider.  Therapeutic Interventions: 1 on 1 counseling sessions, Psychoeducation, Medication administration, Evaluate responses to treatment, Monitor vital signs and CBGs as ordered, Perform/monitor CIWA, COWS, AIMS and Fall Risk screenings as ordered, Perform wound care treatments as ordered.  Evaluation of Outcomes: Not Met   LCSW Treatment Plan for Primary Diagnosis: <principal problem not specified> Long Term Goal(s): Safe transition to appropriate next level of care at discharge, Engage patient in therapeutic group addressing interpersonal concerns.  Short Term Goals: Engage patient in aftercare planning with referrals and resources, Increase social support, Increase emotional regulation, Identify triggers associated with mental health/substance abuse issues and Increase skills for  wellness and recovery  Therapeutic Interventions: Assess for all discharge needs, 1 to 1 time with Social worker, Explore available resources and support systems, Assess for adequacy in community support network, Educate family and significant other(s) on suicide prevention, Complete Psychosocial Assessment, Interpersonal group therapy.  Evaluation of Outcomes: Not Met   Progress in Treatment: Attending groups: Yes. Participating in groups: Yes. Taking medication as prescribed: Yes. Toleration medication: Yes. Family/Significant other contact made: No, will contact:  son, Juanda Crumble Patient understands diagnosis: Yes. Discussing patient identified problems/goals with staff: Yes. Medical problems stabilized or resolved: Yes. Denies suicidal/homicidal ideation: No. Issues/concerns per patient self-inventory: Yes.  New problem(s) identified: Yes, Describe:  limited income  New Short Term/Long Term Goal(s): medication management for mood stabilization; elimination of SI thoughts; development of comprehensive mental wellness/sobriety plan.   Patient Goals:  Medication management for stabilization.  Discharge Plan or Barriers: Patient is declining outpatient follow up at this time.  Reason for Continuation of Hospitalization: Anxiety Delusions  Depression Hallucinations Medication stabilization Suicidal ideation  Estimated Length of Stay: 5-7 days Attendees: Patient: [name] 09/23/2019 9:36 AM  Physician: Dr.Farah 09/23/2019 9:36 AM  Nursing: Vladimir Faster, RN 09/23/2019 9:36 AM  RN Care Manager: 09/23/2019 9:36 AM  Social Worker: Stephanie Acre, [place] 09/23/2019 9:36 AM  Recreational Therapist:  09/23/2019 9:36 AM  Other:  09/23/2019 9:36 AM  Other:  09/23/2019 9:36 AM  Other: 09/23/2019 9:36 AM    Scribe for Treatment Team: Joellen Jersey, [place] 09/23/2019 9:36 AM

## 2019-09-23 NOTE — Progress Notes (Signed)
D: Pt denies SI/HI/AVH. Pt is pleasant and cooperative. Pt appeared confused at times this evening, but would try to blame it on the medication. Pt stated she was ready for D/C A: Pt was offered support and encouragement. Pt was given scheduled medications. Pt was encourage to attend groups. Q 15 minute checks were done for safety.  R:Pt attends groups and interacts well with peers and staff. Pt is taking medication. Pt has no complaints.Pt receptive to treatment and safety maintained on unit.

## 2019-09-23 NOTE — Progress Notes (Signed)
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  She is alert oriented and cooperative without thoughts of harming self or others and she can contract she is less delusional he fixated and again we are helping her review this as an obsession and this is a helpful paradigm for her. 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 Anxiety   . Chronic GERD    History reviewed. No pertinent surgical history. Family History: History reviewed. No pertinent family history. Family Psychiatric  History: see eval Social History:  Social History   Substance and Sexual Activity  Alcohol Use No     Social History   Substance and Sexual Activity  Drug Use Never    Social History   Socioeconomic History  . Marital status: Divorced    Spouse name: Not on file  . Number of children: Not on file  . Years of education: Not on file  . Highest education level: Not on file  Occupational History  . Not on file  Social Needs  . Financial resource strain: Not on file  . Food insecurity    Worry: Not on file    Inability: Not on file  . Transportation needs    Medical: Not on file    Non-medical: Not on file   Tobacco Use  . Smoking status: Current Every Day Smoker    Packs/day: 1.00    Years: 35.00    Pack years: 35.00    Types: Cigarettes  . Smokeless tobacco: Never Used  Substance and Sexual Activity  . Alcohol use: No  . Drug use: Never  . Sexual activity: Never  Lifestyle  . Physical activity    Days per week: Not on file    Minutes per session: Not on file  . Stress: Not on file  Relationships  . Social Musician on phone: Not on file    Gets together: Not on file    Attends religious service: Not on file    Active member of club or organization: Not on file    Attends meetings of clubs or organizations: Not on file    Relationship status: Not on file  Other Topics Concern  . Not on file  Social History Narrative  . Not on file   Additional Social History:                         Sleep: Fair  Appetite:  Fair  Current Medications: Current Facility-Administered Medications  Medication Dose Route Frequency Provider Last Rate Last Dose  . alum & mag hydroxide-simeth (MAALOX/MYLANTA) 200-200-20 MG/5ML suspension 30 mL  30 mL Oral Q4H PRN Jeannine Kitten,  Aaron Edelman, MD   30 mL at 09/22/19 1625  . benztropine (COGENTIN) tablet 1 mg  1 mg Oral BID Johnn Hai, MD   1 mg at 09/23/19 0751  . famotidine (PEPCID) tablet 20 mg  20 mg Oral QHS Johnn Hai, MD   20 mg at 09/22/19 2133  . ondansetron (ZOFRAN-ODT) disintegrating tablet 4 mg  4 mg Oral Q8H PRN Johnn Hai, MD      . pantoprazole (PROTONIX) EC tablet 80 mg  80 mg Oral BID Johnn Hai, MD   80 mg at 09/23/19 0630  . risperiDONE (RISPERDAL) tablet 2 mg  2 mg Oral BID Johnn Hai, MD   2 mg at 09/23/19 0751  . temazepam (RESTORIL) capsule 30 mg  30 mg Oral QHS Johnn Hai, MD   30 mg at 09/22/19 2133    Lab Results:  Results for orders placed or performed during the hospital encounter of 09/20/19 (from the past 48 hour(s))  SARS Coronavirus 2 [facility] order, Performed in [facility] hospital lab)  Nasopharyngeal Nasopharyngeal Swab     Status: None   Collection Time: 09/21/19 10:40 PM   Specimen: Nasopharyngeal Swab  Result Value Ref Range   SARS Coronavirus 2 NEGATIVE NEGATIVE    Comment: (NOTE) If result is NEGATIVE SARS-CoV-2 target nucleic acids are NOT DETECTED. The SARS-CoV-2 RNA is generally detectable in upper and lower  respiratory specimens during the acute phase of infection. The lowest  concentration of SARS-CoV-2 viral copies this assay can detect is 250  copies / mL. A negative result does not preclude SARS-CoV-2 infection  and should not be used as the sole basis for treatment or other  patient management decisions.  A negative result may occur with  improper specimen collection / handling, submission of specimen other  than nasopharyngeal swab, presence of viral mutation(s) within the  areas targeted by this assay, and inadequate number of viral copies  (<250 copies / mL). A negative result must be combined with clinical  observations, patient history, and epidemiological information. If result is POSITIVE SARS-CoV-2 target nucleic acids are DETECTED. The SARS-CoV-2 RNA is generally detectable in upper and lower  respiratory specimens dur ing the acute phase of infection.  Positive  results are indicative of active infection with SARS-CoV-2.  Clinical  correlation with patient history and other diagnostic information is  necessary to determine patient infection status.  Positive results do  not rule out bacterial infection or co-infection with other viruses. If result is PRESUMPTIVE POSTIVE SARS-CoV-2 nucleic acids MAY BE PRESENT.   A presumptive positive result was obtained on the submitted specimen  and confirmed on repeat testing.  While 2019 novel coronavirus  (SARS-CoV-2) nucleic acids may be present in the submitted sample  additional confirmatory testing may be necessary for epidemiological  and / or clinical management purposes  to differentiate  between  SARS-CoV-2 and other Sarbecovirus currently known to infect humans.  If clinically indicated additional testing with an alternate test  methodology [phone]) is advised. The SARS-CoV-2 RNA is generally  detectable in upper and lower respiratory sp ecimens during the acute  phase of infection. The expected result is Negative. Fact Sheet for Patients:  StrictlyIdeas.no Fact Sheet for Healthcare Providers: BankingDealers.co.za This test is not yet approved or cleared by the [place] FDA and has been authorized for detection and/or diagnosis of SARS-CoV-2 by FDA under an Emergency Use Authorization (EUA).  This EUA will remain in effect (meaning this test can be used) for the duration of  the COVID-19 declaration under Section 564(b)(1) of the Act, 21 U.S.C. section 360bbb-3(b)(1), unless the authorization is terminated or revoked sooner. Performed at [facility], [address] [place], [place] [address]     Blood Alcohol level:  Lab Results  Component Value Date   ETH <10 09/20/2019    Metabolic Disorder Labs: No results found for: HGBA1C, MPG No results found for: PROLACTIN Lab Results  Component Value Date   CHOL  07/30/2007    189        ATP III CLASSIFICATION:  <200     mg/dL   Desirable  604-540200-239  mg/dL   Borderline High  >=981>=240    mg/dL   High   TRIG 191115 47/82/956208/13/2008   HDL 26 (L) 07/30/2007   CHOLHDL 7.3 07/30/2007   VLDL 23 07/30/2007   LDLCALC (H) 07/30/2007    140        Total Cholesterol/HDL:CHD Risk Coronary Heart Disease Risk Table                     Men   Women  1/2 Average Risk   3.4   3.3    Physical Findings: AIMS: Facial and Oral Movements Muscles of Facial Expression: None, normal Lips and Perioral Area: None, normal Jaw: None, normal Tongue: None, normal,Extremity Movements Upper (arms, wrists, hands, fingers): None, normal Lower (legs, knees, ankles, toes): None,  normal, Trunk Movements Neck, shoulders, hips: None, normal, Overall Severity Severity of abnormal movements (highest score from questions above): None, normal Incapacitation due to abnormal movements: None, normal Patient's awareness of abnormal movements (rate only patient's report): No Awareness, Dental Status Current problems with teeth and/or dentures?: No Does patient usually wear dentures?: No  CIWA:  CIWA-Ar Total: 1 COWS:  COWS Total Score: 2  Musculoskeletal: Strength & Muscle Tone: within normal limits Gait & Station: normal Patient leans: N/A  Psychiatric Specialty Exam: Physical Exam  ROS  Blood pressure 101/70, pulse 82, temperature 97.7 F (36.5 C), temperature source Oral, resp. rate 18, height 5\' 3"  (1.6 m), weight 64.9 kg.Body mass index is 25.33 kg/m.  General Appearance: Casual  Eye Contact:  Good  Speech:  Clear and Coherent  Volume:  Normal  Mood:  Anxious  Affect:  Appropriate  Thought Process:  Linear and Descriptions of Associations: Circumstantial  Orientation:  Full (Time, Place, and Person)  Thought Content:  Delusions  Suicidal Thoughts:  No  Homicidal Thoughts:  No  Memory:  Immediate;   Fair Recent;   Good Remote;   Good  Judgement:  Fair  Insight:  Fair  Psychomotor Activity:  Normal  Concentration:  Concentration: Fair and Attention Span: Fair  Recall:  [employer]Fair  Fund of Knowledge:  Fair  Language:  Fair  Akathisia:  Negative  Handed:  Right  AIMS (if indicated):     Assets:  Leisure Time Physical Health Resilience  ADL's:  Intact  Cognition:  WNL  Sleep:  Number of Hours: 6.5     Treatment Plan Summary: Daily contact with patient to assess and evaluate symptoms and progress in treatment and Medication management  Continue current cognitive therapy continue reality based therapy meds are adjusted no change in precautions she made some specific requests for changes but then changed her mind were going to stick with her current  regimen probable discharge tomorrow showing more reality basis and her thoughts  Angelus Hoopes, MD 09/23/2019, 8:03 AM

## 2019-09-24 LAB — OVA + PARASITE EXAM

## 2019-09-24 LAB — O&P RESULT

## 2019-09-24 MED ORDER — MIRTAZAPINE 15 MG PO TBDP
15.0000 mg | ORAL_TABLET | Freq: Every day | ORAL | Status: DC
  Administered 2019-09-24: 15 mg via ORAL
  Filled 2019-09-24 (×2): qty 1

## 2019-09-24 MED ORDER — RISPERIDONE 2 MG PO TABS
4.0000 mg | ORAL_TABLET | Freq: Every day | ORAL | Status: DC
  Filled 2019-09-24: qty 2

## 2019-09-24 NOTE — Progress Notes (Signed)
[facility] MD Progress Note  10/8/2[facility]020 9:19 AM [name]  MRN:  [id number] Subjective:    Patient remains a bit hypomanic and intrusive, inserting herself in the affairs of other patients.  She begins rounds by telling me that "my roommate wants to talk to you about her medicine" and then begins advising me on how I should adjust her roommates medication.  At any rate she is redirected to her own care and she reports improvement in her GI symptoms with a full resolution of nausea, she reports that she does not believe she is currently infested so her delusional beliefs are improved.  Her sleep is better but the problem is she has somewhat of a hangover effect in the temazepam, according the staff seems to make her little more confused in the morning and she repeats questions.  This does clear up by midday so we will discontinue the temazepam as listed below. 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 Anxiety   . Chronic GERD    History reviewed. No pertinent surgical history. Family History: History reviewed. No pertinent family history. Family Psychiatric  History: Denies new information here Social History:  Social History   Substance and Sexual Activity  Alcohol Use No     Social History   Substance and Sexual Activity  Drug Use Never    Social History   Socioeconomic History  . Marital status: Divorced    Spouse name: Not on file  . Number of children: Not on file  . Years of education: Not on file  . Highest education level: Not on file  Occupational History  .  Not on file  Social Needs  . Financial resource strain: Not on file  . Food insecurity    Worry: Not on file    Inability: Not on file  . Transportation needs    Medical: Not on file    Non-medical: Not on file  Tobacco Use  . Smoking status: Current Every Day Smoker    Packs/day: 1.00    Years: 35.00    Pack years: 35.00    Types: Cigarettes  . Smokeless tobacco: Never Used  Substance and Sexual Activity  . Alcohol use: No  . Drug use: Never  . Sexual activity: Never  Lifestyle  . Physical activity    Days per week: Not on file    Minutes per session: Not on file  . Stress: Not on file  Relationships  . Social Musicianconnections    Talks on phone: Not on file    Gets together: Not on file    Attends religious service: Not on file    Active member of club or organization: Not on file    Attends meetings of clubs or organizations: Not on file    Relationship status: Not on file  Other Topics Concern  . Not on file  Social History Narrative  . Not on file   Additional Social History:                         Sleep: Good  Appetite:  Good  Current Medications: Current Facility-Administered Medications  Medication Dose Route Frequency Provider Last Rate Last Dose  . alum & mag hydroxide-simeth (MAALOX/MYLANTA) 200-200-20 MG/5ML suspension 30 mL  30 mL Oral Q4H PRN Johnn Hai, MD   30 mL at 09/23/19 1003  . benztropine (COGENTIN) tablet 1 mg  1 mg Oral BID Johnn Hai, MD   1 mg at 09/24/19 0804  . famotidine (PEPCID) tablet 20 mg  20 mg Oral QHS Johnn Hai, MD   20 mg at 09/23/19 2124  . mirtazapine (REMERON SOL-TAB) disintegrating tablet 15 mg  15 mg Oral QHS Johnn Hai, MD      . ondansetron (ZOFRAN-ODT) disintegrating tablet 4 mg  4 mg Oral Q8H PRN Johnn Hai, MD   4 mg at 09/24/19 0804  . pantoprazole (PROTONIX) EC tablet 80 mg  80 mg Oral BID Johnn Hai, MD   80 mg at 09/24/19 0615  . [START ON 09/25/2019] risperiDONE (RISPERDAL) tablet 4 mg  4 mg  Oral QHS Johnn Hai, MD      . sucralfate (CARAFATE) 1 GM/10ML suspension 1 g  1 g Oral TID WC & HS Johnn Hai, MD        Lab Results: No results found for this or any previous visit (from the past 48 hour(s)).  Blood Alcohol level:  Lab Results  Component Value Date   ETH <10 16/09/9603    Metabolic Disorder Labs: No results found for: HGBA1C, MPG No results found for: PROLACTIN Lab Results  Component Value Date   CHOL  07/30/2007    189        ATP III CLASSIFICATION:  <200     mg/dL   Desirable  200-239  mg/dL   Borderline High  >=240    mg/dL   High   TRIG 115 07/30/2007   HDL 26 (L) 07/30/2007   CHOLHDL 7.3 07/30/2007   VLDL 23 07/30/2007   LDLCALC (H) 07/30/2007    140        Total Cholesterol/HDL:CHD Risk Coronary Heart Disease Risk Table                     Men   Women  1/2 Average Risk   3.4   3.3    Physical Findings: AIMS: Facial and Oral Movements Muscles of Facial Expression: None, normal Lips and Perioral Area: None, normal Jaw: None, normal Tongue: None, normal,Extremity Movements Upper (arms, wrists, hands, fingers): None, normal Lower (legs, knees, ankles, toes): None, normal, Trunk Movements Neck, shoulders, hips: None, normal, Overall Severity Severity of abnormal movements (highest score from questions above): None, normal Incapacitation due to abnormal movements: None, normal Patient's awareness of abnormal movements (rate only patient's report): No Awareness, Dental Status Current problems with teeth and/or dentures?: No Does patient usually wear dentures?: No  CIWA:  CIWA-Ar Total: 1 COWS:  COWS Total Score: 2  Musculoskeletal: Strength & Muscle Tone: within normal limits Gait & Station: normal Patient leans: N/A  Psychiatric Specialty Exam: Physical Exam  ROS  Blood pressure (!) 88/69, pulse 99, temperature 97.9 F (36.6 C), resp. rate 16, height 5\' 3"  (1.6 m), weight 64.9 kg, SpO2 96 %.Body mass index is 25.33 kg/m.  General  Appearance: Casual  Eye Contact:  Good  Speech:  Clear and Coherent and Pressured  Volume:  Normal  Mood:  Generally hypomanic at intervals but redirectable  Affect:  Congruent  Thought Process:  Linear and Descriptions of Associations: Circumstantial  Orientation:  Full (Time, Place, and Person)  Thought Content:  Illogical  and Delusions  Suicidal Thoughts:  No  Homicidal Thoughts:  No  Memory:  Immediate;   Fair Recent;   Fair Remote;   Poor  Judgement:  Fair  Insight:  Good  Psychomotor Activity:  Normal  Concentration:  Concentration: Fair  Recall:  Good  Fund of Knowledge:  Fair  Language:  Good  Akathisia:  Negative  Handed:  Right  AIMS (if indicated):     Assets:  Communication Skills Desire for Improvement Leisure Time Physical Health Resilience  ADL's:  Intact  Cognition:  WNL  Sleep:  Number of Hours: 6.5     Treatment Plan Summary: Daily contact with patient to assess and evaluate symptoms and progress in treatment and Medication management  Continue GI medications for her general and chronic GI distress, continue Risperdal but changed to 4 mg at bedtime, replace temazepam with mirtazapine continue reality based therapy probable discharge tomorrow  Malvin Johns, MD 09/24/2019, 9:19 AM

## 2019-09-24 NOTE — Progress Notes (Signed)
Recreation Therapy Notes  Date: 10.8.20 Time: 1000 Location: [address] Dayroom   Group Topic: Leisure Education  Goal Area(s) Addresses:  Patient will identify positive leisure activities.  Patient will identify one positive benefit of participation in leisure activities.   Behavioral Response: Engaged  Intervention: Leisure Building control surveyor, Music  Activity: [place].  Patients and LRT were arranged in a circle.  Participants tossed the ball to each other until the music stopped.  Whoever was holding the ball when the music stopped, was out.  The game continued until there was a winner.    Education:  Leisure Education, Dentist  Education Outcome: Acknowledges education/In group clarification offered/Needs additional education  Clinical Observations/Feedback: Patient was social and engaged with peers.  Pt also encouraged peers throughout group.  Pt was bright and smiling during activity.  Pt expressed she enjoyed the activity because it allowed them to move around.   Victorino Sparrow, LRT/CTRS     Ria Comment, Keyanni Whittinghill A 09/24/2019 11:00 AM

## 2019-09-24 NOTE — Progress Notes (Signed)
D: Pt denies SI/HI/AVH. Pt is pleasant and cooperative. Pt appeared less intrusive this evening, pt visible on milieu this evening.  A: Pt was offered support and encouragement. Pt was given scheduled medications. Pt was encourage to attend groups. Q 15 minute checks were done for safety.   R:Pt attends groups and interacts well with peers and staff. Pt is taking medication. Pt has no complaints .Pt receptive to treatment and safety maintained on unit.

## 2019-09-25 MED ORDER — MIRTAZAPINE 15 MG PO TBDP: 15.0000 mg | ORAL_TABLET | Freq: Every day | ORAL | 1 refills | Status: AC

## 2019-09-25 MED ORDER — BENZTROPINE MESYLATE 1 MG PO TABS: 1.0000 mg | ORAL_TABLET | Freq: Two times a day (BID) | ORAL | 1 refills | Status: AC

## 2019-09-25 MED ORDER — SUCRALFATE 1 GM/10ML PO SUSP: 1.0000 g | Freq: Three times a day (TID) | ORAL | 1 refills | Status: AC

## 2019-09-25 MED ORDER — RISPERIDONE 4 MG PO TABS: 4.0000 mg | ORAL_TABLET | Freq: Every day | ORAL | 2 refills | Status: AC

## 2019-09-25 NOTE — Discharge Summary (Signed)
Physician Discharge Summary Note  Patient:  [name] is an 58 y.o., female  MRN:  [id number]  DOB:  08/23/1961  Patient phone:  [phone] (home)   Patient address:   [address] [place] [id number],   Total Time spent with patient: Greater than 30 minutes  Date of Admission:  09/22/2019  Date of Discharge: 09-25-19  Reason for Admission: Worsening delusions.  Principal Problem: MDD (major depressive disorder), recurrent severe, without psychosis (HCC)  Discharge Diagnoses: Principal Problem:   MDD (major depressive disorder), recurrent severe, without psychosis (HCC)  Past Psychiatric History: Major depressive disorder  Past Medical History:  Past Medical History:  Diagnosis Date  . Anxiety   . Chronic GERD    History reviewed. No pertinent surgical history.  Family History: History reviewed. No pertinent family history.  Family Psychiatric  History: See H&P  Social History:  Social History   Substance and Sexual Activity  Alcohol Use No     Social History   Substance and Sexual Activity  Drug Use Never    Social History   Socioeconomic History  . Marital status: Divorced    Spouse name: Not on file  . Number of children: Not on file  . Years of education: Not on file  . Highest education level: Not on file  Occupational History  . Not on file  Social Needs  . Financial resource strain: Not on file  . Food insecurity    Worry: Not on file    Inability: Not on file  . Transportation needs    Medical: Not on file    Non-medical: Not on file  Tobacco Use  . Smoking status: Current Every Day Smoker    Packs/day: 1.00    Years: 35.00    Pack years: 35.00    Types: Cigarettes  . Smokeless tobacco: Never Used  Substance and Sexual Activity  . Alcohol use: No  . Drug use: Never  . Sexual activity: Never  Lifestyle  . Physical activity    Days per week: Not on file    Minutes per session: Not on file  . Stress: Not  on file  Relationships  . Social Musicianconnections    Talks on phone: Not on file    Gets together: Not on file    Attends religious service: Not on file    Active member of club or organization: Not on file    Attends meetings of clubs or organizations: Not on file    Relationship status: Not on file  Other Topics Concern  . Not on file  Social History Narrative  . Not on file   Hospital Course: (Per Md's admission evaluation): Patient reports no prior psychiatric admissions but has had numerous health care encounters in this system. She is 58 years of age, divorced and came to our attention through the emergency department on 09/20/19.  EMS had been called the patient was pacing around her home with a knife and intending on either stabbing herself or cutting her wrist. The patient is driven by the delusional believes that she has been infested with parasites and "worms" and believes she has had visual evidence of these worms in her stool.  She suffers from insomnia, obsessing about this somatic delusional issue, and even went so far as to ingest an antiparasitic, believe the ivermectin, from a veterinary preparation that she states she got from a friend who works with a Psychiatristlarge animal veterinarian.  Brooke RhodesBetty was admitted to  the [facility] adult unit for worsening symptoms of delusional disorder evidenced by her persistent delusions on being infected with parasites. She was brought to the hospital after she was observed holding a knife & pacing around her home intending to stab or cut herself. She was brought to the hospital for evaluation & mood mood stabilization treatments.   After evaluation of her symptoms, the medication regimen targeting [name]'s symptoms were discussed & initiated with her consent. She was medicated, stabilized & discharged on the medications as listed on her discharge medication lists. She was oriented to the unit & encouraged to participate in the unit programming. She presented other  significant pre-existing medical problems that required treatment. She was resumed & discharged on her pertinent home medications for those health issues. She tolerated her treatment regimen without any adverse effects or reactions reported.  During her hospital stay, [name] was evaluated daily by a clinical provider to ascertain her response to her treatment regimen. As the days go by, improvement was noted as evidenced by her report of decreasing symptoms, improved mood, affect & participation in the unit programming. [name] was required on daily basis to complete a self-inventory asssessment noting mood, mental status, any new symptoms, anxiety or other concerns. Her symptoms responded well to her treatment regimen, being in a therapeutic and supportive environment also assisted in her mood stability.   On this day of her hospital discharge, [name] was in much improved condition than upon admission. Her symptoms were reported as significantly decreased or resolved completely. Upon discharge, she denies SI/HI and voiced no AVH. She was motivated to continue taking medication with a goal of continued improvement in mental health. [name] is discharged to follow-up care on an outpatient basis as noted below. She is provided with all the necessary information needed to make this appointment without problems. She left [facility] in no apparent distress with all personal belongings. Transportation per [employer] .  Physical Findings: AIMS: Facial and Oral Movements Muscles of Facial Expression: None, normal Lips and Perioral Area: None, normal Jaw: None, normal Tongue: None, normal,Extremity Movements Upper (arms, wrists, hands, fingers): None, normal Lower (legs, knees, ankles, toes): None, normal, Trunk Movements Neck, shoulders, hips: None, normal, Overall Severity Severity of abnormal movements (highest score from questions above): None, normal Incapacitation due to abnormal movements:  None, normal Patient's awareness of abnormal movements (rate only patient's report): No Awareness, Dental Status Current problems with teeth and/or dentures?: No Does patient usually wear dentures?: No  CIWA:  CIWA-Ar Total: 1 COWS:  COWS Total Score: 2  Musculoskeletal: Strength & Muscle Tone: within normal limits Gait & Station: normal Patient leans: N/A  Psychiatric Specialty Exam: Physical Exam  Nursing note and vitals reviewed. Constitutional: She is oriented to person, place, and time. She appears well-developed.  Neck: Normal range of motion.  Cardiovascular: Normal rate.  Respiratory: No respiratory distress. She has no wheezes.  Genitourinary:    Genitourinary Comments: Deferred   Musculoskeletal: Normal range of motion.  Neurological: She is alert and oriented to person, place, and time.  Skin: Skin is warm.    Review of Systems  Constitutional: Negative for chills and fever.  Respiratory: Negative for cough, shortness of breath and wheezing.   Cardiovascular: Negative for chest pain and palpitations.  Gastrointestinal: Negative for heartburn, nausea and vomiting.  Neurological: Negative for dizziness and headaches.  Psychiatric/Behavioral: Positive for depression (Stabilized with medication prior to discharge) and hallucinations (Hx. Psychosis (stable)). Negative for memory loss, substance abuse and suicidal  ideas. The patient has insomnia (Stabilized with medication prior to discharge). The patient is not nervous/anxious (Stable).     Blood pressure (!) 88/69, pulse 99, temperature 97.9 F (36.6 C), resp. rate 16, height 5\' 3"  (1.6 m), weight 64.9 kg, SpO2 96 %.Body mass index is 25.33 kg/m.  See Md's discharge SRA   Has this patient used any form of tobacco in the last 30 days? (Cigarettes, Smokeless Tobacco, Cigars, and/or Pipes) Yes, N/A  Blood Alcohol level:  Lab Results  Component Value Date   ETH <10 79/48/0165   Metabolic Disorder Labs:  No results  found for: HGBA1C, MPG No results found for: PROLACTIN Lab Results  Component Value Date   CHOL  07/30/2007    189        ATP III CLASSIFICATION:  <200     mg/dL   Desirable  200-239  mg/dL   Borderline High  >=240    mg/dL   High   TRIG 115 07/30/2007   HDL 26 (L) 07/30/2007   CHOLHDL 7.3 07/30/2007   VLDL 23 07/30/2007   LDLCALC (H) 07/30/2007    140        Total Cholesterol/HDL:CHD Risk Coronary Heart Disease Risk Table                     Men   Women  1/2 Average Risk   3.4   3.3   See Psychiatric Specialty Exam and Suicide Risk Assessment completed by Attending Physician prior to discharge.  Discharge destination:  Home  Is patient on multiple antipsychotic therapies at discharge:  No   Has Patient had three or more failed trials of antipsychotic monotherapy by history:  No  Recommended Plan for Multiple Antipsychotic Therapies: NA  Allergies as of 09/25/2019   No Known Allergies     Medication List    STOP taking these medications   acetaminophen 500 MG tablet Commonly known as: TYLENOL   methocarbamol 500 MG tablet Commonly known as: ROBAXIN     TAKE these medications     Indication  benztropine 1 MG tablet Commonly known as: COGENTIN Take 1 tablet (1 mg total) by mouth 2 (two) times daily.  Indication: Extrapyramidal Reaction caused by Medications   escitalopram 10 MG tablet Commonly known as: LEXAPRO Take 10 mg by mouth daily.  Indication: Major Depressive Disorder   famotidine 20 MG tablet Commonly known as: PEPCID Take 20 mg by mouth at bedtime.  Indication: Gastroesophageal Reflux Disease   Melatonin 3 MG Tabs Take 1 tablet by mouth at bedtime as needed (sleep).  Indication: Trouble Sleeping   mirtazapine 15 MG disintegrating tablet Commonly known as: REMERON SOL-TAB Take 1 tablet (15 mg total) by mouth at bedtime.  Indication: Major Depressive Disorder   omeprazole 40 MG capsule Commonly known as: PRILOSEC Take 40 mg by mouth 2  (two) times daily.  Indication: Indigestion   ondansetron 4 MG disintegrating tablet Commonly known as: ZOFRAN-ODT Take 4 mg by mouth every 8 (eight) hours as needed for nausea.  Indication: Nausea and Vomiting   risperidone 4 MG tablet Commonly known as: RISPERDAL Take 1 tablet (4 mg total) by mouth at bedtime.  Indication: Delusions, Delusions of Parasitosis   sucralfate 1 GM/10ML suspension Commonly known as: CARAFATE Take 10 mLs (1 g total) by mouth 4 (four) times daily -  with meals and at bedtime.  Indication: Stomach Ulcer      Follow-up Information    Patient denies wanting aftercare  follow up. Follow up.   Why: Patient states that she will schedule her own after care appointments.         Follow-up recommendations: Activity:  As tolerated Diet: As recommended by your primary care doctor. Keep all scheduled follow-up appointments as recommended.    Comments: Prescriptions given at discharge.  Patient agreeable to plan.  Given opportunity to ask questions.  Appears to feel comfortable with discharge denies any current suicidal or homicidal thought. Patient is also instructed prior to discharge to: Take all medications as prescribed by his/her mental healthcare provider. Report any adverse effects and or reactions from the medicines to his/her outpatient provider promptly. Patient has been instructed & cautioned: To not engage in alcohol and or illegal drug use while on prescription medicines. In the event of worsening symptoms, patient is instructed to call the crisis hotline, 911 and or go to the nearest ED for appropriate evaluation and treatment of symptoms. To follow-up with his/her primary care provider for your other medical issues, concerns and or health care needs.   Signed: Armandina Stammer, NP, PMHNP, FNP-BC 09/25/2019, 1:50 PM

## 2019-09-25 NOTE — Plan of Care (Signed)
Pt is being discharged from hospital.    Sherree Shankman, LRT/CTRS 

## 2019-09-25 NOTE — Progress Notes (Signed)
Recreation Therapy Notes  INPATIENT RECREATION TR PLAN  Patient Details Name: BAUDELIA SCHROEPFER MRN: [id number] DOB: [birth year] Today's Date: 09/25/2019  Rec Therapy Plan Is patient appropriate for Therapeutic Recreation?: Yes Treatment times per week: about 3 days Estimated Length of Stay: 5-7 days TR Treatment/Interventions: Group participation (Comment)  Discharge Criteria Pt will be discharged from therapy if:: Discharged Treatment plan/goals/alternatives discussed and agreed upon by:: Patient/family  Discharge Summary Short term goals set: See patient care plan Short term goals met: Not met Progress toward goals comments: Groups attended Which groups?: Goal setting, Communication, Leisure education Reason goals not met: Pt discharged Therapeutic equipment acquired: None Reason patient discharged from therapy: Discharge from hospital Pt/family agrees with progress & goals achieved: Yes Date patient discharged from therapy: 09/25/19   Victorino Sparrow, LRT/CTRS  Ria Comment, [place] 09/25/2019, 11:30 AM

## 2019-09-25 NOTE — [facility] Suicide Risk Assessment (Signed)
[facility] Discharge Suicide Risk Assessment   Principal Problem: delusional disorder Discharge Diagnoses: Active Problems:   MDD (major depressive disorder), recurrent severe, without psychosis ([place])   Total Time spent with patient: 45 minutes  Musculoskeletal: Strength & Muscle Tone: within normal limits Gait & Station: normal Patient leans: N/A  Psychiatric Specialty Exam: ROS  Blood pressure (!) 88/69, pulse 99, temperature 97.9 F (36.6 C), resp. rate 16, height 5\' 3"  (1.6 m), weight 64.9 kg, SpO2 96 %.Body mass index is 25.33 kg/m.  General Appearance: Casual  Eye Contact::  Good  Speech:  Clear and [id number]  Volume:  Normal  Mood:  Euthymic  Affect:  Appropriate  Thought Process:  Coherent and Goal Directed  Orientation:  Full (Time, Place, and Person)  Thought Content:  Tangential  Suicidal Thoughts:  No  Homicidal Thoughts:  No  Memory:  Immediate;   Fair Recent;   Fair Remote;   Good  Judgement:  Fair  Insight:  Fair  Psychomotor Activity:  Normal  Concentration:  Fair  Recall:  [place] of Knowledge:Good  Language: Good  Akathisia:  Negative  Handed:  Right  AIMS (if indicated):     Assets:  Communication Skills Desire for Improvement  Sleep:  Number of Hours: 6.25  Cognition: WNL  ADL's:  Intact   Mental Status Per Nursing Assessment::   On Admission:  Suicide plan, Suicidal ideation indicated by patient  Demographic Factors:  Caucasian  Loss Factors: Decrease in vocational status  Historical Factors: NA  Risk Reduction Factors:   Sense of responsibility to family and Religious beliefs about death  Continued Clinical Symptoms:  Medical Diagnoses and Treatments/Surgeries  Cognitive Features That Contribute To Risk:  Closed-mindedness    Suicide Risk:  Minimal: No identifiable suicidal ideation.  Patients presenting with no risk factors but with morbid ruminations; may be classified as minimal risk based on the severity of the depressive  symptoms  Follow-up Information    Patient denies wanting aftercare follow up. Follow up.   Why: Patient states that she will schedule her own after care appointments.          Plan Of Care/Follow-up recommendations:  Activity:  full  Evian Derringer, MD 09/25/2019, 8:37 AM

## 2019-09-25 NOTE — Progress Notes (Signed)
Patient ID: [name], female   DOB: 06-01-1961, 58 y.o.   MRN: [id number]   CSW scheduled [employer] ride for 11am. CSW informed pt's nurse.

## 2019-09-25 NOTE — Tx Team (Signed)
Interdisciplinary Treatment and Diagnostic Plan Update  09/25/2019 Time of Session: 10:25am  [name] MRN: [id number]  Principal Diagnosis: <principal problem not specified>  Secondary Diagnoses: Active Problems:   MDD (major depressive disorder), recurrent severe, without psychosis (HCC)   Current Medications:  Current Facility-Administered Medications  Medication Dose Route Frequency Provider Last Rate Last Dose  . alum & mag hydroxide-simeth (MAALOX/MYLANTA) 200-200-20 MG/5ML suspension 30 mL  30 mL Oral Q4H PRN Malvin Johns, MD   30 mL at 09/24/19 1724  . benztropine (COGENTIN) tablet 1 mg  1 mg Oral BID Malvin Johns, MD   1 mg at 09/25/19 0733  . famotidine (PEPCID) tablet 20 mg  20 mg Oral QHS Malvin Johns, MD   20 mg at 09/24/19 2106  . mirtazapine (REMERON SOL-TAB) disintegrating tablet 15 mg  15 mg Oral QHS Malvin Johns, MD   15 mg at 09/24/19 2106  . ondansetron (ZOFRAN-ODT) disintegrating tablet 4 mg  4 mg Oral Q8H PRN Malvin Johns, MD   4 mg at 09/24/19 1729  . pantoprazole (PROTONIX) EC tablet 80 mg  80 mg Oral BID Malvin Johns, MD   80 mg at 09/25/19 0733  . risperiDONE (RISPERDAL) tablet 4 mg  4 mg Oral QHS Malvin Johns, MD      . sucralfate (CARAFATE) 1 GM/10ML suspension 1 g  1 g Oral TID WC & HS Malvin Johns, MD   1 g at 09/25/19 4742   Current Outpatient Medications  Medication Sig Dispense Refill  . benztropine (COGENTIN) 1 MG tablet Take 1 tablet (1 mg total) by mouth 2 (two) times daily. 60 tablet 1  . escitalopram (LEXAPRO) 10 MG tablet Take 10 mg by mouth daily.    . famotidine (PEPCID) 20 MG tablet Take 20 mg by mouth at bedtime.     . Melatonin 3 MG TABS Take 1 tablet by mouth at bedtime as needed (sleep).    . mirtazapine (REMERON SOL-TAB) 15 MG disintegrating tablet Take 1 tablet (15 mg total) by mouth at bedtime. 90 tablet 1  . omeprazole (PRILOSEC) 40 MG capsule Take 40 mg by mouth 2 (two) times daily.     . ondansetron (ZOFRAN-ODT) 4 MG  disintegrating tablet Take 4 mg by mouth every 8 (eight) hours as needed for nausea.    . risperiDONE (RISPERDAL) 4 MG tablet Take 1 tablet (4 mg total) by mouth at bedtime. 30 tablet 2  . sucralfate (CARAFATE) 1 GM/10ML suspension Take 10 mLs (1 g total) by mouth 4 (four) times daily -  with meals and at bedtime. 420 mL 1   PTA Medications: No medications prior to admission.    Patient Stressors: Financial difficulties Health problems Medication change or noncompliance  Patient Strengths: Geographical information systems officer for treatment/growth  Treatment Modalities: Medication Management, Group therapy, Case management,  1 to 1 session with clinician, Psychoeducation, Recreational therapy.   Physician Treatment Plan for Primary Diagnosis: <principal problem not specified> Long Term Goal(s): Improvement in symptoms so as ready for discharge Improvement in symptoms so as ready for discharge   Short Term Goals: Ability to identify changes in lifestyle to reduce recurrence of condition will improve Ability to verbalize feelings will improve Ability to identify and develop effective coping behaviors will improve Ability to maintain clinical measurements within normal limits will improve Ability to identify and develop effective coping behaviors will improve Ability to maintain clinical measurements within normal limits will improve Compliance with prescribed medications will improve Ability to identify triggers  associated with substance abuse/mental health issues will improve  Medication Management: Evaluate patient's response, side effects, and tolerance of medication regimen.  Therapeutic Interventions: 1 to 1 sessions, Unit Group sessions and Medication administration.  Evaluation of Outcomes: Adequate for Discharge  Physician Treatment Plan for Secondary Diagnosis: Active Problems:   MDD (major depressive disorder), recurrent severe, without psychosis (HCC)  Long Term  Goal(s): Improvement in symptoms so as ready for discharge Improvement in symptoms so as ready for discharge   Short Term Goals: Ability to identify changes in lifestyle to reduce recurrence of condition will improve Ability to verbalize feelings will improve Ability to identify and develop effective coping behaviors will improve Ability to maintain clinical measurements within normal limits will improve Ability to identify and develop effective coping behaviors will improve Ability to maintain clinical measurements within normal limits will improve Compliance with prescribed medications will improve Ability to identify triggers associated with substance abuse/mental health issues will improve     Medication Management: Evaluate patient's response, side effects, and tolerance of medication regimen.  Therapeutic Interventions: 1 to 1 sessions, Unit Group sessions and Medication administration.  Evaluation of Outcomes: Adequate for Discharge   RN Treatment Plan for Primary Diagnosis: <principal problem not specified> Long Term Goal(s): Knowledge of disease and therapeutic regimen to maintain health will improve  Short Term Goals: Ability to participate in decision making will improve, Ability to verbalize feelings will improve, Ability to disclose and discuss suicidal ideas, Ability to identify and develop effective coping behaviors will improve and Compliance with prescribed medications will improve  Medication Management: RN will administer medications as ordered by provider, will assess and evaluate patient's response and provide education to patient for prescribed medication. RN will report any adverse and/or side effects to prescribing provider.  Therapeutic Interventions: 1 on 1 counseling sessions, Psychoeducation, Medication administration, Evaluate responses to treatment, Monitor vital signs and CBGs as ordered, Perform/monitor CIWA, COWS, AIMS and Fall Risk screenings as ordered,  Perform wound care treatments as ordered.  Evaluation of Outcomes: Adequate for Discharge   LCSW Treatment Plan for Primary Diagnosis: <principal problem not specified> Long Term Goal(s): Safe transition to appropriate next level of care at discharge, Engage patient in therapeutic group addressing interpersonal concerns.  Short Term Goals: Engage patient in aftercare planning with referrals and resources and Increase skills for wellness and recovery  Therapeutic Interventions: Assess for all discharge needs, 1 to 1 time with Social worker, Explore available resources and support systems, Assess for adequacy in community support network, Educate family and significant other(s) on suicide prevention, Complete Psychosocial Assessment, Interpersonal group therapy.  Evaluation of Outcomes: Adequate for Discharge   Progress in Treatment: Attending groups: Yes. Participating in groups: Yes. Taking medication as prescribed: Yes. Toleration medication: Yes. Family/Significant other contact made: Yes, individual(s) contacted:  pt's son Patient understands diagnosis: No. Discussing patient identified problems/goals with staff: Yes. Medical problems stabilized or resolved: Yes. Denies suicidal/homicidal ideation: Yes. Issues/concerns per patient self-inventory: No. Other:   New problem(s) identified:   New Short Term/Long Term Goal(s): Medication stabilization, elimination of SI thoughts, and development of a comprehensive mental wellness plan.   Patient Goals:  Medication management for stabilization.  Discharge Plan or Barriers: pt is being discharge today   Reason for Continuation of Hospitalization: pt is being discharge today   Estimated Length of Stay: pt is being discharge today   Attendees: Patient: [name] 09/25/2019 11:43 AM  Physician: Dr. Jeannine KittenFarah, MD 09/25/2019 11:43 AM  Nursing: Marton Redwoodoni, RN 09/25/2019 11:43  AM  RN Care Manager: 09/25/2019 11:43 AM  Social Worker:  Verner Chol 09/25/2019 11:43 AM  Recreational Therapist:  09/25/2019 11:43 AM  Other: Ovidio Kin, MSW intern  09/25/2019 11:43 AM  Other:  09/25/2019 11:43 AM  Other: 09/25/2019 11:43 AM    Scribe for Treatment Team: Billey Chang, Student-Social Work 09/25/2019 11:43 AM

## 2019-09-25 NOTE — Progress Notes (Signed)
Recreation Therapy Notes  Date: 10.9.20 Time: 1000 Location: 400 Hall Dayroom  Group Topic: Goal Setting  Goal Area(s) Addresses:  Patient will be able to identify at least 3 life goals.  Patient will be able to identify benefit of investing in life goals.  Patient will be able to identify benefit of setting life goals.   Behavioral Response:  None  Intervention:  Worksheet  Activity: Goal Planning.  Patients were to identify what they wanted to accomplish in a week, month, year and 5 years.  Patients would then identify any obstacles they may face, what they need to achieve goals and what they can start doing now to work towards goals.  Education:  Discharge Planning, Radiographer, therapeutic, Leisure Education   Education Outcome: Acknowledges Education/In Group Clarification Provided/Needs Additional Education  Clinical Observations:  Pt was unable to focus.  Pt was preoccupied with going home.  Pt was anxious and needed constant redirection.    Victorino Sparrow, LRT/CTRS    Ria Comment, Maddex Garlitz A 09/25/2019 11:04 AM

## 2019-09-25 NOTE — Progress Notes (Signed)
  [facility] Adult Case Management Discharge Plan :  Will you be returning to the same living situation after discharge:  Yes,  pt's home At discharge, do you have transportation home?: Yes,  pt will get a lyft Do you have the ability to pay for your medications: Yes,  pt has insurance   Release of information consent forms completed and in the chart;  Patient's signature needed at discharge.  Patient to Follow up at: Follow-up Information    Patient denies wanting aftercare follow up. Follow up.   Why: Patient states that she will schedule her own after care appointments.          Next level of care provider has access to [place] and Suicide Prevention discussed: Yes,  with pt's son      Has patient been referred to the Quitline?: Patient refused referral  Patient has been referred for addiction treatment: Pt. refused referral  Billey Chang, Student-Social Work 09/25/2019, 9:38 AM

## 2019-09-25 NOTE — Plan of Care (Signed)
Patient still cleared to be discharged but we have discerned through her stay here that she has some early dementia and her delusional believes may have been a harbinger.  But she can recall 2 of 3 and 0 of 3 and speaks of relatives were no longer alive.  Again she is not dangerous at this point in time and her delusions have resolved so she can be discharged at her request but we recommend that at follow-up she receive dementia care as well to include Namenda/Aricept/B vitamins

## 2020-11-10 IMAGING — DX DG CHEST 2V
2 series · 2 of 2 positions shown · non-contrast
Comparison: Radiographs and CT scan March 09, 2019.

CLINICAL DATA: Chest pain, shortness of breath.

EXAM:
CHEST - 2 VIEW

[chest pa]
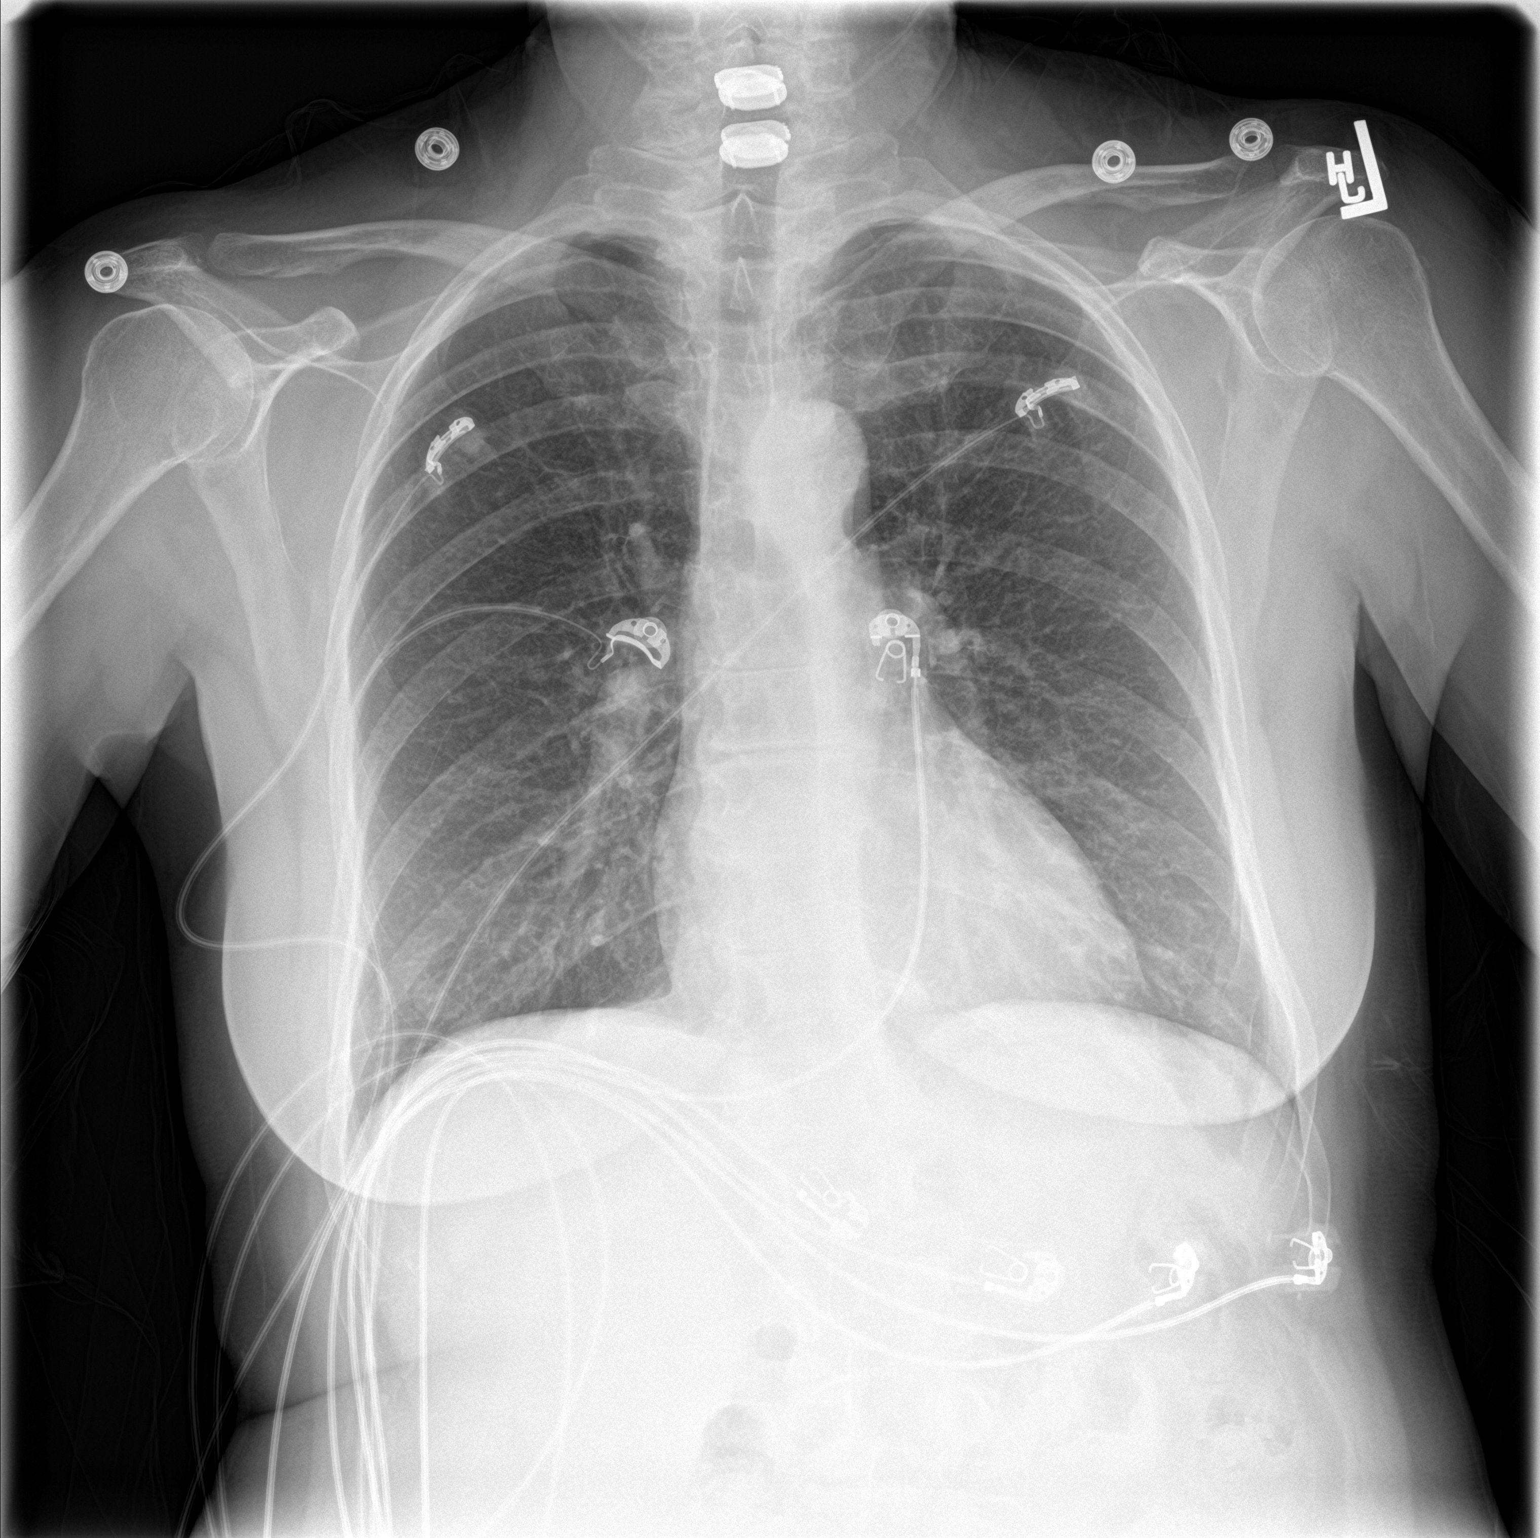

[chest lat]
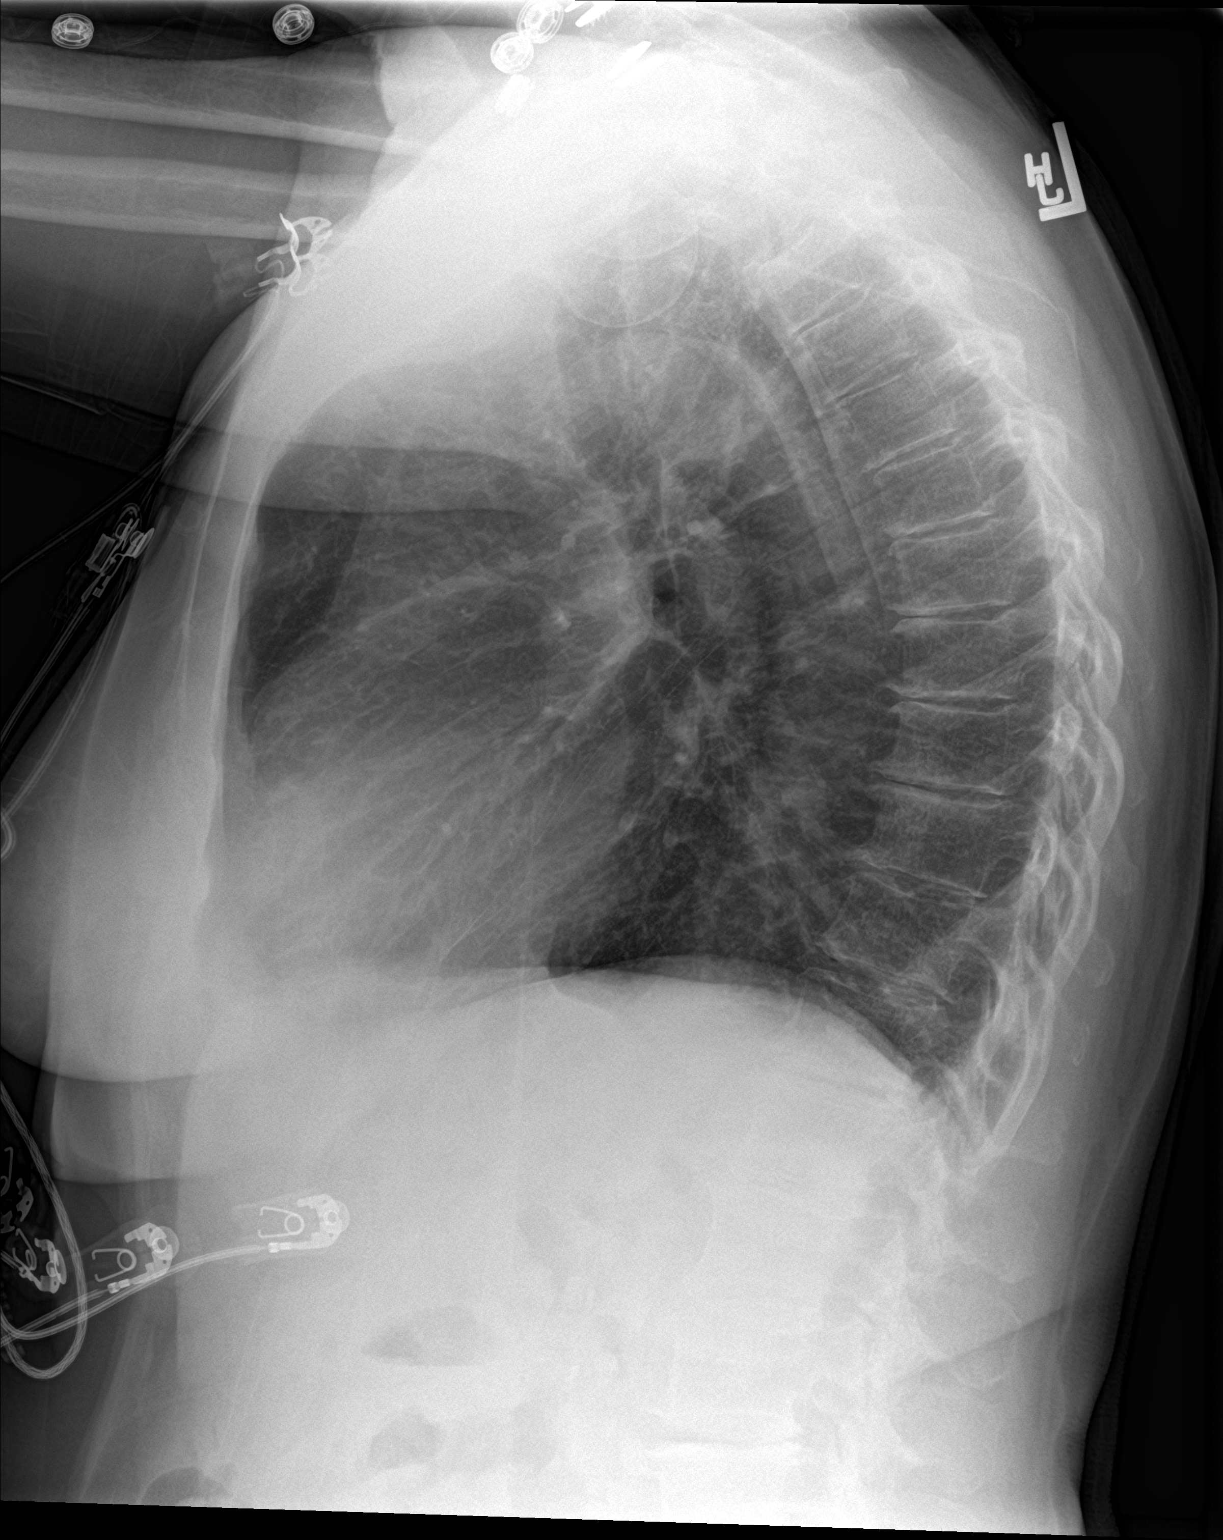

[2 of 2 positions shown; findings below may reference images not displayed]

FINDINGS: The heart size and mediastinal contours are within normal limits.
Both lungs are clear. No pneumothorax or pleural effusion is noted.
The visualized skeletal structures are unremarkable.
IMPRESSION: No active cardiopulmonary disease.
